# Patient Record
Sex: Male | Born: 1965 | Race: White | Hispanic: No | Marital: Married | Smoking: Never smoker
Health system: Southern US, Community
[De-identification: ages and names within clinical notes are randomized; demographics above are authoritative.]

## PROBLEM LIST (undated history)

## (undated) DIAGNOSIS — I10 Essential (primary) hypertension: Secondary | ICD-10-CM

## (undated) DIAGNOSIS — J40 Bronchitis, not specified as acute or chronic: Secondary | ICD-10-CM

## (undated) DIAGNOSIS — K760 Fatty (change of) liver, not elsewhere classified: Secondary | ICD-10-CM

## (undated) DIAGNOSIS — C8190 Hodgkin lymphoma, unspecified, unspecified site: Secondary | ICD-10-CM

## (undated) HISTORY — PX: HERNIA REPAIR: SHX51

## (undated) HISTORY — PX: SPLENECTOMY, TOTAL: SHX788

---
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
## 2015-07-08 ENCOUNTER — Encounter: Payer: Self-pay | Admitting: Emergency Medicine

## 2015-07-08 ENCOUNTER — Ambulatory Visit
Admission: EM | Admit: 2015-07-08 | Discharge: 2015-07-08 | Disposition: A | Discharge status: Home / Self Care | Payer: Federal, State, Local not specified - PPO | Attending: Family Medicine | Admitting: Family Medicine

## 2015-07-08 ENCOUNTER — Ambulatory Visit: Payer: Federal, State, Local not specified - PPO

## 2015-07-08 DIAGNOSIS — J40 Bronchitis, not specified as acute or chronic: Secondary | ICD-10-CM | POA: Diagnosis not present

## 2015-07-08 HISTORY — DX: Hodgkin lymphoma, unspecified, unspecified site: C81.90

## 2015-07-08 MED ORDER — PREDNISONE 20 MG PO TABS: ORAL_TABLET | ORAL | Status: DC

## 2015-07-08 NOTE — ED Provider Notes (Signed)
CSN: [id number]     Arrival date & time 07/08/15  1812 History   First MD Initiated Contact with Patient 07/08/15 1856     Chief Complaint  Patient presents with  . Cough   (Consider location/radiation/quality/duration/timing/severity/associated sxs/prior Treatment) HPI Comments: 49 yo non-smoker with a 2 months h/o cough, mostly dry. States was seen about 2 months ago at another facility for this and was treated with prednisone for one week with improvement of symptoms but not complete resolution. Denies any wheezing, fevers, chills, chest pains. States has felt slightly short of breath. Denies chest pains or swelling.   Patient is a 49 y.o. male presenting with cough. The history is provided by the patient.  Cough   Past Medical History  Diagnosis Date  . Hodgkin disease    Past Surgical History  Procedure Laterality Date  . Hernia repair    . Splenectomy, total     No family history on file. Social History  Substance Use Topics  . Smoking status: Never Smoker   . Smokeless tobacco: None  . Alcohol Use: No    Review of Systems  Respiratory: Positive for cough.     Allergies  Penicillins  Home Medications   Prior to Admission medications   Medication Sig Start Date End Date Taking? Authorizing Provider  predniSONE (DELTASONE) 20 MG tablet 3 tabs po qd for 2 days, then 2 tabs po qd for 4 days, then 1 tab po qd for 4 days, then half a tab po qd for 2 days 07/08/15   Norval Gable, MD   Meds Ordered and Administered this Visit  Medications - No data to display  BP 128/91 mmHg  Pulse 79  Temp(Src) 98 F (36.7 C) (Tympanic)  Resp 18  Ht 6\' 1"  (1.854 m)  Wt 210 lb (95.255 kg)  BMI 27.71 kg/m2  SpO2 96% No data found.   Physical Exam  Constitutional: He appears well-developed and well-nourished. No distress.  HENT:  Head: Normocephalic and atraumatic.  Right Ear: Tympanic membrane, external ear and ear canal normal.  Left Ear: Tympanic membrane, external ear  and ear canal normal.  Nose: Nose normal.  Mouth/Throat: Uvula is midline, oropharynx is clear and moist and mucous membranes are normal. No oropharyngeal exudate or tonsillar abscesses.  Eyes: Conjunctivae and EOM are normal. Pupils are equal, round, and reactive to light. Right eye exhibits no discharge. Left eye exhibits no discharge. No scleral icterus.  Neck: Normal range of motion. Neck supple. No tracheal deviation present. No thyromegaly present.  Cardiovascular: Normal rate, regular rhythm and normal heart sounds.   Pulmonary/Chest: Effort normal. No stridor. No respiratory distress. He has no wheezes. He has no rales. He exhibits no tenderness.  Few expiratory rhonchi bilaterally  Lymphadenopathy:    He has no cervical adenopathy.  Neurological: He is alert.  Skin: Skin is warm and dry. No rash noted. He is not diaphoretic.  Nursing note and vitals reviewed.   ED Course  Procedures (including critical care time)  Labs Review Labs Reviewed - No data to display  Imaging Review Dg Chest 2 View  07/08/2015   CLINICAL DATA:  Cough. Borderline hypertension. Bronchitis 2 months ago.  EXAM: CHEST  2 VIEW  COMPARISON:  None.  FINDINGS: Normal sized heart. Clear lungs. Minimal central peribronchial thickening. Upper abdominal surgical clips. Unremarkable bones.  IMPRESSION: Minimal bronchitic changes.   Electronically Signed   By: Claudie Revering M.D.   On: 07/08/2015 19:37     Visual  Acuity Review  Right Eye Distance:   Left Eye Distance:   Bilateral Distance:    Right Eye Near:   Left Eye Near:    Bilateral Near:         MDM   1. Bronchitis    New Prescriptions   PREDNISONE (DELTASONE) 20 MG TABLET    3 tabs po qd for 2 days, then 2 tabs po qd for 4 days, then 1 tab po qd for 4 days, then half a tab po qd for 2 days  Plan: 1. x-ray results and diagnosis reviewed with patient 2. rx as per orders; risks, benefits, potential side effects reviewed with patient 3.  Recommend supportive treatment with otc cough supresant prn 4. F/u prn if symptoms worsen or don't improve    Norval Gable, MD 07/08/15 [phone]

## 2015-07-08 NOTE — ED Notes (Signed)
Dry cough for 2 months has recently got worse.

## 2015-08-13 DIAGNOSIS — I10 Essential (primary) hypertension: Secondary | ICD-10-CM | POA: Insufficient documentation

## 2015-08-13 DIAGNOSIS — C8190 Hodgkin lymphoma, unspecified, unspecified site: Secondary | ICD-10-CM | POA: Insufficient documentation

## 2015-08-13 HISTORY — DX: Hodgkin lymphoma, unspecified, unspecified site: C81.90

## 2015-09-06 ENCOUNTER — Encounter: Payer: Self-pay | Admitting: Emergency Medicine

## 2015-09-06 ENCOUNTER — Emergency Department
Admission: EM | Admit: 2015-09-06 | Discharge: 2015-09-07 | Disposition: A | Discharge status: Home / Self Care | Payer: Federal, State, Local not specified - PPO | Attending: Emergency Medicine | Admitting: Emergency Medicine

## 2015-09-06 DIAGNOSIS — Z880 Allergy status to penicillin: Secondary | ICD-10-CM | POA: Diagnosis not present

## 2015-09-06 DIAGNOSIS — K8020 Calculus of gallbladder without cholecystitis without obstruction: Secondary | ICD-10-CM | POA: Diagnosis not present

## 2015-09-06 DIAGNOSIS — R1011 Right upper quadrant pain: Secondary | ICD-10-CM | POA: Diagnosis present

## 2015-09-06 LAB — COMPREHENSIVE METABOLIC PANEL
ALK PHOS: 56 U/L (ref 38–126)
ALT: 53 U/L (ref 17–63)
ANION GAP: 8 (ref 5–15)
AST: 40 U/L (ref 15–41)
Albumin: 4.4 g/dL (ref 3.5–5.0)
BILIRUBIN TOTAL: 0.3 mg/dL (ref 0.3–1.2)
BUN: 22 mg/dL — ABNORMAL HIGH (ref 6–20)
CALCIUM: 9.7 mg/dL (ref 8.9–10.3)
CO2: 31 mmol/L (ref 22–32)
Chloride: 102 mmol/L (ref 101–111)
Creatinine, Ser: 1.24 mg/dL (ref 0.61–1.24)
GLUCOSE: 145 mg/dL — AB (ref 65–99)
POTASSIUM: 4.4 mmol/L (ref 3.5–5.1)
Sodium: 141 mmol/L (ref 135–145)
TOTAL PROTEIN: 7.9 g/dL (ref 6.5–8.1)

## 2015-09-06 LAB — URINALYSIS COMPLETE WITH MICROSCOPIC (ARMC ONLY)
BACTERIA UA: NONE SEEN
Bilirubin Urine: NEGATIVE
GLUCOSE, UA: NEGATIVE mg/dL
HGB URINE DIPSTICK: NEGATIVE
KETONES UR: NEGATIVE mg/dL
LEUKOCYTES UA: NEGATIVE
NITRITE: NEGATIVE
PROTEIN: NEGATIVE mg/dL
RBC / HPF: NONE SEEN RBC/hpf (ref 0–5)
SPECIFIC GRAVITY, URINE: 1.018 (ref 1.005–1.030)
Squamous Epithelial / LPF: NONE SEEN
pH: 5 (ref 5.0–8.0)

## 2015-09-06 LAB — CBC
HEMATOCRIT: 45.7 % (ref 40.0–52.0)
HEMOGLOBIN: 15 g/dL (ref 13.0–18.0)
MCH: 29.2 pg (ref 26.0–34.0)
MCHC: 32.8 g/dL (ref 32.0–36.0)
MCV: 89 fL (ref 80.0–100.0)
Platelets: 279 10*3/uL (ref 150–440)
RBC: 5.14 MIL/uL (ref 4.40–5.90)
RDW: 13.5 % (ref 11.5–14.5)
WBC: 12.5 10*3/uL — AB (ref 3.8–10.6)

## 2015-09-06 LAB — TROPONIN I: Troponin I: <0.03 ng/mL (ref <0.031)

## 2015-09-06 LAB — LIPASE, BLOOD: Lipase: 33 U/L (ref 11–51)

## 2015-09-06 MED ORDER — MORPHINE SULFATE (PF) 2 MG/ML IV SOLN
2.0000 mg | Freq: Once | INTRAVENOUS | Status: AC
  Administered 2015-09-06: 2 mg via INTRAVENOUS

## 2015-09-06 MED ORDER — ONDANSETRON HCL 4 MG/2ML IJ SOLN
4.0000 mg | Freq: Once | INTRAMUSCULAR | Status: AC
  Administered 2015-09-06: 4 mg via INTRAVENOUS

## 2015-09-06 MED ORDER — MORPHINE SULFATE (PF) 2 MG/ML IV SOLN
2.0000 mg | Freq: Once | INTRAVENOUS | Status: AC
  Filled 2015-09-06: qty 1

## 2015-09-06 MED ORDER — ONDANSETRON HCL 4 MG/2ML IJ SOLN
4.0000 mg | Freq: Once | INTRAMUSCULAR | Status: AC
  Filled 2015-09-06: qty 2

## 2015-09-06 NOTE — ED Provider Notes (Signed)
[facility] Emergency Department Provider Note  ____________________________________________  Time seen: 10:45 PM  I have reviewed the triage vital signs and the nursing notes.   HISTORY  Chief Complaint Abdominal Pain     HPI Albert Meyer is a 49 y.o. male presents with acute onset of right upper quadrant/epigastric abdominal pain at 6:30 PM this evening. Patient admits to previous episode of the same following eating. Patient states pain occurred today before having dinner. Patient admits to nausea no fever or diarrhea     Past Medical History  Diagnosis Date  . Hodgkin disease ([place])     There are no active problems to display for this patient.   Past Surgical History  Procedure Laterality Date  . Hernia repair    . Splenectomy, total      Current Outpatient Rx  Name  Route  Sig  Dispense  Refill  . predniSONE (DELTASONE) 20 MG tablet      3 tabs po qd for 2 days, then 2 tabs po qd for 4 days, then 1 tab po qd for 4 days, then half a tab po qd for 2 days   20 tablet   0     Allergies Penicillins  No family history on file.  Social History Social History  Substance Use Topics  . Smoking status: Never Smoker   . Smokeless tobacco: None  . Alcohol Use: No    Review of Systems  Constitutional: Negative for fever. Eyes: Negative for visual changes. ENT: Negative for sore throat. Cardiovascular: Negative for chest pain. Respiratory: Negative for shortness of breath. Gastrointestinal: Positive right upper quadrant abdominal pain, negative vomiting and diarrhea. Genitourinary: Negative for dysuria. Musculoskeletal: Negative for back pain. Skin: Negative for rash. Neurological: Negative for headaches, focal weakness or numbness.   10-point ROS otherwise negative.  ____________________________________________   PHYSICAL EXAM:  VITAL SIGNS: ED Triage Vitals  Enc Vitals Group     BP 09/06/15 2132 170/78 mmHg     Pulse  Rate 09/06/15 2132 77     Resp 09/06/15 2132 20     Temp 09/06/15 2132 97.8 F (36.6 C)     Temp Source 09/06/15 2132 Oral     SpO2 09/06/15 2132 97 %     Weight 09/06/15 2132 200 lb (90.719 kg)     Height 09/06/15 2132 6\' 1"  (1.854 m)     Head Cir --      Peak Flow --      Pain Score 09/06/15 2134 8     Pain Loc --      Pain Edu? --      Excl. in [place]? --      Constitutional: Alert and oriented. Well appearing and in no distress. Eyes: Conjunctivae are normal. PERRL. Normal extraocular movements. ENT   Head: Normocephalic and atraumatic.   Nose: No congestion/rhinnorhea.   Mouth/Throat: Mucous membranes are moist.   Neck: No stridor. Hematological/Lymphatic/Immunilogical: No cervical lymphadenopathy. Cardiovascular: Normal rate, regular rhythm. Normal and symmetric distal pulses are present in all extremities. No murmurs, rubs, or gallops. Respiratory: Normal respiratory effort without tachypnea nor retractions. Breath sounds are clear and equal bilaterally. No wheezes/rales/rhonchi. Gastrointestinal: Right upper quadrant tenderness to palpation. No distention. There is no CVA tenderness. Genitourinary: deferred Musculoskeletal: Nontender with normal range of motion in all extremities. No joint effusions.  No lower extremity tenderness nor edema. Neurologic:  Normal speech and language. No gross focal neurologic deficits are appreciated. Speech is normal.  Skin:  Skin  is warm, dry and intact. No rash noted. Psychiatric: Mood and affect are normal. Speech and behavior are normal. Patient exhibits appropriate insight and judgment.  ____________________________________________    LABS (pertinent positives/negatives)  Labs Reviewed  COMPREHENSIVE METABOLIC PANEL - Abnormal; Notable for the following:    Glucose, Bld 145 (*)    BUN 22 (*)    All other components within normal limits  CBC - Abnormal; Notable for the following:    WBC 12.5 (*)    All other components  within normal limits  URINALYSIS COMPLETEWITH MICROSCOPIC ([facility] ONLY) - Abnormal; Notable for the following:    Color, Urine YELLOW (*)    APPearance CLEAR (*)    All other components within normal limits  LIPASE, BLOOD  TROPONIN I     ____________________________________________   EKG  ED ECG REPORT I, BROWN, Hobbs N, the attending physician, personally viewed and interpreted this ECG.   Date: 09/07/2015  EKG Time: 9:42 PM  Rate: 69  Rhythm: Normal sinus rhythm  Axis: None  Intervals: Normal  ST&T Change: None   ____________________________________________    RADIOLOGY  US Abdomen Limited RUQ (Final result) Result time: 09/07/15 01:14:55   Procedure changed from US Abdomen Limited      Final result by Rad Results In Interface (09/07/15 01:14:55)   Narrative:   CLINICAL DATA: Acute onset of right upper quadrant abdominal pain. Initial encounter.  EXAM: US ABDOMEN LIMITED - RIGHT UPPER QUADRANT  COMPARISON: None.  FINDINGS: Gallbladder:  Multiple large nonmobile stones are seen, with a 1.8 cm stone seen lodged at the neck of the gallbladder. No gallbladder wall thickening or pericholecystic fluid is seen, but a positive ultrasonographic Murphy's sign is elicited.  Common bile duct:  Diameter: 0.4 cm, within normal limits in caliber.  Liver:  No focal lesion identified. Within normal limits in parenchymal echogenicity.  IMPRESSION: 1.8 cm stone lodged at the neck of the gallbladder, with additional large stones seen. Positive ultrasonographic Murphy's sign elicited. This raises concern for obstruction due to the stone. No evidence for cholecystitis.   Electronically Signed By: Garald Balding M.D. On: 09/07/2015 01:14         INITIAL IMPRESSION / ASSESSMENT AND PLAN / ED COURSE  Pertinent labs & imaging results that were available during my care of the patient were reviewed by me and considered in my medical decision making  (see chart for details). Patient received IV morphine and Zofran for analgesia and antiemetic. History of physical exam consistent with cholelithiasis as such ultrasound was performed which confirmed the presence of cholelithiasis with a large stone lodged in the neck of the gallbladder. Given this finding Dr. Rexene Edison general surgeon on call was consult with ____________________________________________   FINAL CLINICAL IMPRESSION(S) / ED DIAGNOSES  Final diagnoses:  Calculus of gallbladder without cholecystitis without obstruction      Gregor Hams, MD 09/07/15 [phone]

## 2015-09-06 NOTE — ED Notes (Signed)
Dr. Owens Shark gave verbal orders for 4mg  Zofran IV, 2mg  Morphine IV.  Orders repeated and entered.

## 2015-09-06 NOTE — ED Notes (Signed)
MD at bedside. 

## 2015-09-06 NOTE — ED Notes (Addendum)
Patient ambulatory to triage with steady gait, without difficulty or distress noted; pt reports right upper abd pain radiating into right lower back since 7pm accomp by urinary frequency and SHOB and nausea

## 2015-09-07 ENCOUNTER — Emergency Department (HOSPITAL_COMMUNITY): Payer: Federal, State, Local not specified - PPO

## 2015-09-07 ENCOUNTER — Emergency Department: Payer: Federal, State, Local not specified - PPO

## 2015-09-07 DIAGNOSIS — K8020 Calculus of gallbladder without cholecystitis without obstruction: Secondary | ICD-10-CM

## 2015-09-07 DIAGNOSIS — R1011 Right upper quadrant pain: Secondary | ICD-10-CM | POA: Insufficient documentation

## 2015-09-07 MED ORDER — ONDANSETRON 4 MG PO TBDP: 4.0000 mg | ORAL_TABLET | Freq: Three times a day (TID) | ORAL | Status: DC | PRN

## 2015-09-07 MED ORDER — OXYCODONE-ACETAMINOPHEN 5-325 MG PO TABS: 1.0000 | ORAL_TABLET | ORAL | Status: DC | PRN

## 2015-09-07 NOTE — ED Notes (Signed)
Provided patient education on untreated hypertension in relation to increased stroke risk and kidney damage.

## 2015-09-07 NOTE — Discharge Instructions (Signed)

## 2015-09-07 NOTE — Consult Note (Signed)
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  Began suddenly after dinner.  Had chicken wings and french fries for dinner. Similar episode 1 week ago which resolved spontaneously.  Currently without any pain whatsoever.  No sick contacts, no others who enjoyed same meal with similar pains.  No fevers/chills, night sweats, shortness of breath, cough, chest pain, nausea/vomiting, diarrhea/constipation, dysuria/hematuria.    Active Ambulatory Problems    Diagnosis Date Noted  . Calculus of gallbladder without cholecystitis without obstruction   . RUQ pain    Resolved Ambulatory Problems    Diagnosis Date Noted  . No Resolved Ambulatory Problems   Past Medical History  Diagnosis Date  . Hodgkin disease [facility])    Past Surgical History  Procedure Laterality Date  . Hernia repair    . Splenectomy, total       Medication List    TAKE these medications        ondansetron 4 MG disintegrating tablet  Commonly known as:  ZOFRAN ODT  Take 1 tablet (4 mg total) by mouth every 8 (eight) hours as needed for nausea or vomiting.     oxyCODONE-acetaminophen 5-325 MG tablet  Commonly known as:  ROXICET  Take 1 tablet by mouth every 4 (four) hours as needed for severe pain.      ASK your doctor about these medications        predniSONE 20 MG tablet  Commonly known as:  DELTASONE  3 tabs po qd for 2 days, then 2 tabs po qd for 4 days, then 1 tab po qd for 4 days, then half a tab po qd for 2 days       Allergies  Allergen Reactions  . Penicillins    No family history on file.   Social History   Social History  . Marital Status: Married    Spouse Name: N/A  . Number of Children: N/A  . Years of Education: N/A   Occupational History  . Not on file.   Social History Main Topics  . Smoking status: Never Smoker   . Smokeless tobacco: Not on file  . Alcohol Use: No  .  Drug Use: No  . Sexual Activity: Not on file   Other Topics Concern  . Not on file   Social History Narrative   ROS: Full ROS obtained, pertinent positives and negatives as above  Blood pressure 143/97, pulse 73, temperature 97.8 F (36.6 C), temperature source Oral, resp. rate 14, height 6\' 1"  (1.854 m), weight 200 lb (90.719 kg), SpO2 97 %. GEN: NAD/A&Ox3 FACE: no obvious facial trauma, normal external nose, normal external ears EYES: no scleral icterus, no conjunctivitis HEAD: normocephalic atraumatic CV: RRR, no MRG RESP: moving air well, lungs clear ABD: soft, nontender, nondistended, large transverse epigastric incision EXT: moving all ext well, strength 5/5 NEURO: cnII-XII grossly intact, sensation intact all 4 ext  Labs: Personally reviewed, significant for  WBC 12.5 Bili 0.3 AST/ALT 40/53 Lipase 33  Ultrasound: Personally reviewed, significant for  Multiple large nonmobile stones with lodged stone in neck of GB  A/P 49 yo M with gallstones, recurrent RUQ pain, now resolved.  I have discussed laparoscopic cholecystectomy with him and that he would likely have another episode in the future, but in the absence of pain and with normal labs and VS that it is not emergent.  He  would like to schedule a surgery in the future as an outpatient.  I have told him the risks and benefits of the procedure and what signs and symptoms he should return to the ER or to call our office with.  He and his wife expressed understanding.

## 2015-09-08 ENCOUNTER — Telehealth: Payer: Self-pay | Admitting: General Surgery

## 2015-09-08 NOTE — Telephone Encounter (Signed)
yes

## 2015-09-08 NOTE — Telephone Encounter (Signed)
L/M ON PTS C# ASKING HIM TO CALL Monday 09-13-15 TO [facility] AN APPT .OKED PER JWB  (SEEN IN ER &HAD U/'S @ [facility] 09-06-15,EVAL GB) SEE TELEPHONE ENCOUNTER I SENT TO DR BYRNETT & HIS RESPONSE/MTH

## 2015-09-08 NOTE — Telephone Encounter (Signed)
PT CALLED & WAS SEEN IN THE ER ON 09-07-15 FOR HIS GB(HAD ABD U/S)he HAS AN APPT WITH DR Delfino Lovett COOPER ON 10-01-15 & SX WOULD PROPERLY .THEY TOLD HIM TO CALL HERE & SEE IF WE COULD SEE HIM SOONER.PLEASE REVIEW HIS RECS & LET ME KNOW IF WE CAN SEE HIM & HOW SOON APPT IS NEEDED.TKS/MTH

## 2015-09-13 ENCOUNTER — Encounter: Payer: Self-pay | Admitting: General Surgery

## 2015-09-13 NOTE — Telephone Encounter (Signed)
ON 09-08-15 I RET'D CALL TO PT & L/M TO CALL BACK.WE WERE GOING TO OFFER AN APPT FOR 09-13-15. TODAY 09-13-15 I CALLED PT & HAVE SCH'D AN APPT WITH DR BYRNETT FOR 09-21-15./MTH

## 2015-09-21 ENCOUNTER — Ambulatory Visit (INDEPENDENT_AMBULATORY_CARE_PROVIDER_SITE_OTHER): Payer: Federal, State, Local not specified - PPO | Admitting: General Surgery

## 2015-09-21 ENCOUNTER — Encounter: Payer: Self-pay | Admitting: General Surgery

## 2015-09-21 VITALS — BP 136/78 | HR 76 | Resp 14 | Ht 74.0 in | Wt 201.0 lb

## 2015-09-21 DIAGNOSIS — K8020 Calculus of gallbladder without cholecystitis without obstruction: Secondary | ICD-10-CM | POA: Diagnosis not present

## 2015-09-21 NOTE — Progress Notes (Signed)
Patient ID: Albert Meyer, male   DOB: 1965/12/13, 49 y.o.   MRN: [id number]  Chief Complaint  Patient presents with  . Other    gallstones    HPI Albert Meyer is a 49 y.o. male here today for a evaluation of 3 episodes of abdominal pain His first episode was 09-02-15 that last 7-8 hours. Patient was seen in the ER on 09/06/15 and had a ultrasound done. He states the pain was upper right abdominal pain that lasted 7-8 hours. He had eaten something fatty and fried prior to the episode. He was nauseated but no vomiting.   His most recent episode was 09-08-15, which only lasted 25 minutes. This episode was associated with chills. No change in the color of his urine during these episodes. He has since then stopped drinking energy drinks (caffeine) and is making better choices with his foods and no further episodes. He works in [employer], Dance movement psychotherapist. I personally reviewed the patient's medical history. HPI  Past Medical History  Diagnosis Date  . Hodgkin disease ([place]) 1978    Past Surgical History  Procedure Laterality Date  . Hernia repair    . Splenectomy, total      Family History  Problem Relation Age of Onset  . Diabetes Mother     Social History Social History  Substance Use Topics  . Smoking status: Never Smoker   . Smokeless tobacco: Never Used  . Alcohol Use: No    Allergies  Allergen Reactions  . Penicillins     No current outpatient prescriptions on file.   No current facility-administered medications for this visit.    Review of Systems Review of Systems  Constitutional: Negative.   Respiratory: Negative.   Cardiovascular: Negative.   Gastrointestinal: Positive for nausea and abdominal pain. Negative for vomiting and diarrhea.    Blood pressure 136/78, pulse 76, resp. rate 14, height 6\' 2"  (1.88 m), weight 201 lb (91.173 kg).  Physical Exam Physical Exam  Constitutional: He is oriented to person, place, and time. He appears well-developed and  well-nourished.  HENT:  Mouth/Throat: Oropharynx is clear and moist.  Eyes: Conjunctivae are normal. No scleral icterus.  Neck: Neck supple.  Cardiovascular: Normal rate, regular rhythm and normal heart sounds.   Pulmonary/Chest: Effort normal and breath sounds normal.  Abdominal: Soft. Normal appearance and bowel sounds are normal. There is no tenderness.    Lymphadenopathy:    He has no cervical adenopathy.  Neurological: He is alert and oriented to person, place, and time.  Skin: Skin is warm and dry.  Psychiatric: His behavior is normal.    Data Reviewed CLINICAL DATA: Acute onset of right upper quadrant abdominal pain. Initial encounter.  EXAM: US ABDOMEN LIMITED - RIGHT UPPER QUADRANT  COMPARISON: None.  FINDINGS: Gallbladder:  Multiple large nonmobile stones are seen, with a 1.8 cm stone seen lodged at the neck of the gallbladder. No gallbladder wall thickening or pericholecystic fluid is seen, but a positive ultrasonographic Murphy's sign is elicited.  Common bile duct:  Diameter: 0.4 cm, within normal limits in caliber.  Liver:  No focal lesion identified. Within normal limits in parenchymal echogenicity.  IMPRESSION: 1.8 cm stone lodged at the neck of the gallbladder, with additional large stones seen. Positive ultrasonographic Murphy's sign elicited. This raises concern for obstruction due to the stone. No evidence for cholecystitis.  Laboratory studies of 09/06/2015 showed a white blood cell count 12,500. Elevated blood sugar 145, creatinine 1.2. Normal liver function studies.  Assessment  Biliary colic.    Plan    Indications for surgical intervention were reviewed. The possibility of scarring from his previous laparotomy were discussed. Laparoscopy would still be the first choice for evaluation. He may be@at  higher risk for an open procedure.    Laparoscopic Cholecystectomy with Intraoperative Cholangiogram. The procedure,  including it's potential risks and complications (including but not limited to infection, bleeding, injury to intra-abdominal organs or bile ducts, bile leak, poor cosmetic result, sepsis and death) were discussed with the patient in detail. Non-operative options, including their inherent risks (acute calculous cholecystitis with possible choledocholithiasis or gallstone pancreatitis, with the risk of ascending cholangitis, sepsis, and death) were discussed as well. The patient expressed and understanding of what we discussed and wishes to proceed with laparoscopic cholecystectomy. The patient further understands that if it is technically not possible, or it is unsafe to proceed laparoscopically, that I will convert to an open cholecystectomy.   PCP: Dr Newell Coral at [place] 09/22/2015, 4:02 PM

## 2015-09-21 NOTE — Patient Instructions (Signed)
Laparoscopic Cholecystectomy Laparoscopic cholecystectomy is surgery to remove the gallbladder. The gallbladder is located in the upper right part of the abdomen, behind the liver. It is a storage sac for bile, which is produced in the liver. Bile aids in the digestion and absorption of fats. Cholecystectomy is often done for inflammation of the gallbladder (cholecystitis). This condition is usually caused by a buildup of gallstones (cholelithiasis) in the gallbladder. Gallstones can block the flow of bile, and that can result in inflammation and pain. In severe cases, emergency surgery may be required. If emergency surgery is not required, you will have time to prepare for the procedure. Laparoscopic surgery is an alternative to open surgery. Laparoscopic surgery has a shorter recovery time. Your common bile duct may also need to be examined during the procedure. If stones are found in the common bile duct, they may be removed. LET YOUR HEALTH CARE PROVIDER KNOW ABOUT:  Any allergies you have.  All medicines you are taking, including vitamins, herbs, eye drops, creams, and over-the-counter medicines.  Previous problems you or members of your family have had with the use of anesthetics.  Any blood disorders you have.  Previous surgeries you have had.  Any medical conditions you have. RISKS AND COMPLICATIONS Generally, this is a safe procedure. However, problems may occur, including:  Infection.  Bleeding.  Allergic reactions to medicines.  Damage to other structures or organs.  A stone remaining in the common bile duct.  A bile leak from the cyst duct that is clipped when your gallbladder is removed.  The need to convert to open surgery, which requires a larger incision in the abdomen. This may be necessary if your surgeon thinks that it is not safe to continue with a laparoscopic procedure. BEFORE THE PROCEDURE  Ask your health care provider about:  Changing or stopping your  regular medicines. This is especially important if you are taking diabetes medicines or blood thinners.  Taking medicines such as aspirin and ibuprofen. These medicines can thin your blood. Do not take these medicines before your procedure if your health care provider instructs you not to.  Follow instructions from your health care provider about eating or drinking restrictions.  Let your health care provider know if you develop a cold or an infection before surgery.  Plan to have someone take you home after the procedure.  Ask your health care provider how your surgical site will be marked or identified.  You may be given antibiotic medicine to help prevent infection. PROCEDURE  To reduce your risk of infection:  Your health care team will wash or sanitize their hands.  Your skin will be washed with soap.  An IV tube may be inserted into one of your veins.  You will be given a medicine to make you fall asleep (general anesthetic).  A breathing tube will be placed in your mouth.  The surgeon will make several small cuts (incisions) in your abdomen.  A thin, lighted tube (laparoscope) that has a tiny camera on the end will be inserted through one of the small incisions. The camera on the laparoscope will send a picture to a TV screen (monitor) in the operating room. This will give the surgeon a good view inside your abdomen.  A gas will be pumped into your abdomen. This will expand your abdomen to give the surgeon more room to perform the surgery.  Other tools that are needed for the procedure will be inserted through the other incisions. The gallbladder will   be removed through one of the incisions.  After your gallbladder has been removed, the incisions will be closed with stitches (sutures), staples, or skin glue.  Your incisions may be covered with a bandage (dressing). The procedure may vary among health care providers and hospitals. AFTER THE PROCEDURE  Your blood  pressure, heart rate, breathing rate, and blood oxygen level will be monitored often until the medicines you were given have worn off.  You will be given medicines as needed to control your pain.   This information is not intended to replace advice given to you by your health care provider. Make sure you discuss any questions you have with your health care provider.   Document Released: 10/02/2005 Document Revised: 06/23/2015 Document Reviewed: 05/14/2013 Elsevier Interactive Patient Education 2016 Elsevier Inc.  

## 2015-09-22 ENCOUNTER — Telehealth: Payer: Self-pay | Admitting: *Deleted

## 2015-09-22 DIAGNOSIS — K8020 Calculus of gallbladder without cholecystitis without obstruction: Secondary | ICD-10-CM | POA: Insufficient documentation

## 2015-09-22 NOTE — H&P (Signed)
HPI  Albert Meyer is a 49 y.o. male here today for a evaluation of 3 episodes of abdominal pain His first episode was 09-02-15 that last 7-8 hours. Patient was seen in the ER on 09/06/15 and had a ultrasound done. He states the pain was upper right abdominal pain that lasted 7-8 hours. He had eaten something fatty and fried prior to the episode. He was nauseated but no vomiting. His most recent episode was 09-08-15, which only lasted 25 minutes. This episode was associated with chills. No change in the color of his urine during these episodes.  He has since then stopped drinking energy drinks (caffeine) and is making better choices with his foods and no further episodes.  He works in [employer], Dance movement psychotherapist.  I personally reviewed the patient's medical history.  HPI  Past Medical History   Diagnosis  Date   .  Hodgkin disease ([place])  1978    Past Surgical History   Procedure  Laterality  Date   .  Hernia repair     .  Splenectomy, total      Family History   Problem  Relation  Age of Onset   .  Diabetes  Mother     Social History  Social History   Substance Use Topics   .  Smoking status:  Never Smoker   .  Smokeless tobacco:  Never Used   .  Alcohol Use:  No    Allergies   Allergen  Reactions   .  Penicillins     No current outpatient prescriptions on file.    No current facility-administered medications for this visit.    Review of Systems  Review of Systems  Constitutional: Negative.  Respiratory: Negative.  Cardiovascular: Negative.  Gastrointestinal: Positive for nausea and abdominal pain. Negative for vomiting and diarrhea.   Blood pressure 136/78, pulse 76, resp. rate 14, height 6\' 2"  (1.88 m), weight 201 lb (91.173 kg).  Physical Exam  Physical Exam  Constitutional: He is oriented to person, place, and time. He appears well-developed and well-nourished.  HENT:  Mouth/Throat: Oropharynx is clear and moist.  Eyes: Conjunctivae are normal. No scleral icterus.    Neck: Neck supple.  Cardiovascular: Normal rate, regular rhythm and normal heart sounds.  Pulmonary/Chest: Effort normal and breath sounds normal.  Abdominal: Soft. Normal appearance and bowel sounds are normal. There is no tenderness.    Lymphadenopathy:  He has no cervical adenopathy.  Neurological: He is alert and oriented to person, place, and time.  Skin: Skin is warm and dry.  Psychiatric: His behavior is normal.   Data Reviewed  CLINICAL DATA: Acute onset of right upper quadrant abdominal pain.  Initial encounter.  EXAM:  US ABDOMEN LIMITED - RIGHT UPPER QUADRANT  COMPARISON: None.  FINDINGS:  Gallbladder:  Multiple large nonmobile stones are seen, with a 1.8 cm stone seen  lodged at the neck of the gallbladder. No gallbladder wall  thickening or pericholecystic fluid is seen, but a positive  ultrasonographic Murphy's sign is elicited.  Common bile duct:  Diameter: 0.4 cm, within normal limits in caliber.  Liver:  No focal lesion identified. Within normal limits in parenchymal  echogenicity.  IMPRESSION:  1.8 cm stone lodged at the neck of the gallbladder, with additional  large stones seen. Positive ultrasonographic Murphy's sign elicited.  This raises concern for obstruction due to the stone. No evidence  for cholecystitis.  Laboratory studies of 09/06/2015 showed a white blood cell count 12,500. Elevated blood sugar  145, creatinine 1.2. Normal liver function studies.  Assessment   Biliary colic.   Plan   Indications for surgical intervention were reviewed. The possibility of scarring from his previous laparotomy were discussed. Laparoscopy would still be the first choice for evaluation. He may be@at  higher risk for an open procedure.   Laparoscopic Cholecystectomy with Intraoperative Cholangiogram. The procedure, including it's potential risks and complications (including but not limited to infection, bleeding, injury to intra-abdominal organs or bile ducts,  bile leak, poor cosmetic result, sepsis and death) were discussed with the patient in detail. Non-operative options, including their inherent risks (acute calculous cholecystitis with possible choledocholithiasis or gallstone pancreatitis, with the risk of ascending cholangitis, sepsis, and death) were discussed as well. The patient expressed and understanding of what we discussed and wishes to proceed with laparoscopic cholecystectomy. The patient further understands that if it is technically not possible, or it is unsafe to proceed laparoscopically, that I will convert to an open cholecystectomy.  PCP: Dr Newell Coral at [place], [place]

## 2015-09-22 NOTE — Telephone Encounter (Signed)
Patient called back and wants to try and arrange surgery in December instead of January as previously stated.  This patient's surgery has been scheduled for 10-08-15 at [facility].

## 2015-09-22 NOTE — Telephone Encounter (Signed)
Message for patient to call the office.   We need to schedule patient's gallbladder surgery for January 2017.

## 2015-10-01 ENCOUNTER — Encounter: Payer: Self-pay | Admitting: *Deleted

## 2015-10-01 ENCOUNTER — Ambulatory Visit: Payer: Federal, State, Local not specified - PPO | Admitting: Surgery

## 2015-10-01 ENCOUNTER — Other Ambulatory Visit: Payer: Federal, State, Local not specified - PPO

## 2015-10-01 NOTE — Patient Instructions (Signed)
  Your procedure is scheduled on: 10-08-15 Report to [facility] To find out your arrival time please call [phone] between 1PM - 3PM on 10-07-15  Remember: Instructions that are not followed completely may result in serious medical risk, up to and including death, or upon the discretion of your surgeon and anesthesiologist your surgery may need to be rescheduled.    _X___ 1. Do not eat food or drink liquids after midnight. No gum chewing or hard candies.     _X___ 2. No Alcohol for 24 hours before or after surgery.   ____ 3. Bring all medications with you on the day of surgery if instructed.    ____ 4. Notify your doctor if there is any change in your medical condition     (cold, fever, infections).     Do not wear jewelry, make-up, hairpins, clips or nail polish.  Do not wear lotions, powders, or perfumes. You may wear deodorant.  Do not shave 48 hours prior to surgery. Men may shave face and neck.  Do not bring valuables to the hospital.    [facility] is not responsible for any belongings or valuables.               Contacts, dentures or bridgework may not be worn into surgery.  Leave your suitcase in the car. After surgery it may be brought to your room.  For patients admitted to the hospital, discharge time is determined by your treatment team.   Patients discharged the day of surgery will not be allowed to drive home.   Please read over the following fact sheets that you were given:    ____ Take these medicines the morning of surgery with A SIP OF WATER:    1. NONE  2.   3.   4.  5.  6.  ____ Fleet Enema (as directed)   ____ Use CHG Soap as directed  ____ Use inhalers on the day of surgery  ____ Stop metformin 2 days prior to surgery    ____ Take 1/2 of usual insulin dose the night before surgery and none on the morning of surgery.   ____ Stop Coumadin/Plavix/aspirin-N/A  ____ Stop Anti-inflammatories   ____ Stop  supplements until after surgery.    ____ Bring C-Pap to the hospital.

## 2015-10-08 ENCOUNTER — Ambulatory Visit: Payer: Federal, State, Local not specified - PPO

## 2015-10-08 ENCOUNTER — Ambulatory Visit: Payer: Federal, State, Local not specified - PPO | Admitting: Certified Registered Nurse Anesthetist

## 2015-10-08 ENCOUNTER — Ambulatory Visit
Admission: RE | Admit: 2015-10-08 | Discharge: 2015-10-08 | Disposition: A | Discharge status: Home / Self Care | Payer: Federal, State, Local not specified - PPO | Source: Ambulatory Visit | Attending: General Surgery | Admitting: General Surgery

## 2015-10-08 ENCOUNTER — Encounter
Admission: RE | Disposition: A | Discharge status: Home / Self Care | Payer: Self-pay | Source: Ambulatory Visit | Attending: General Surgery

## 2015-10-08 DIAGNOSIS — K660 Peritoneal adhesions (postprocedural) (postinfection): Secondary | ICD-10-CM | POA: Diagnosis not present

## 2015-10-08 DIAGNOSIS — Z833 Family history of diabetes mellitus: Secondary | ICD-10-CM | POA: Insufficient documentation

## 2015-10-08 DIAGNOSIS — Z9081 Acquired absence of spleen: Secondary | ICD-10-CM | POA: Diagnosis not present

## 2015-10-08 DIAGNOSIS — Z880 Allergy status to penicillin: Secondary | ICD-10-CM | POA: Diagnosis not present

## 2015-10-08 DIAGNOSIS — K8010 Calculus of gallbladder with chronic cholecystitis without obstruction: Secondary | ICD-10-CM | POA: Insufficient documentation

## 2015-10-08 DIAGNOSIS — K8044 Calculus of bile duct with chronic cholecystitis without obstruction: Secondary | ICD-10-CM | POA: Diagnosis not present

## 2015-10-08 DIAGNOSIS — Z8571 Personal history of Hodgkin lymphoma: Secondary | ICD-10-CM | POA: Insufficient documentation

## 2015-10-08 DIAGNOSIS — Z9889 Other specified postprocedural states: Secondary | ICD-10-CM | POA: Insufficient documentation

## 2015-10-08 DIAGNOSIS — Z419 Encounter for procedure for purposes other than remedying health state, unspecified: Secondary | ICD-10-CM

## 2015-10-08 HISTORY — PX: CHOLECYSTECTOMY: SHX55

## 2015-10-08 HISTORY — DX: Bronchitis, not specified as acute or chronic: J40

## 2015-10-08 SURGERY — LAPAROSCOPIC CHOLECYSTECTOMY
Anesthesia: General

## 2015-10-08 MED ORDER — DEXAMETHASONE SODIUM PHOSPHATE 10 MG/ML IJ SOLN
INTRAMUSCULAR | Status: DC | PRN
  Administered 2015-10-08: 5 mg via INTRAVENOUS

## 2015-10-08 MED ORDER — SUCCINYLCHOLINE CHLORIDE 20 MG/ML IJ SOLN
INTRAMUSCULAR | Status: DC | PRN
  Administered 2015-10-08: 160 mg via INTRAVENOUS

## 2015-10-08 MED ORDER — ACETAMINOPHEN 10 MG/ML IV SOLN
INTRAVENOUS | Status: AC
  Filled 2015-10-08: qty 100

## 2015-10-08 MED ORDER — MIDAZOLAM HCL 2 MG/2ML IJ SOLN
INTRAMUSCULAR | Status: DC | PRN
  Administered 2015-10-08: 2 mg via INTRAVENOUS

## 2015-10-08 MED ORDER — LIDOCAINE HCL (CARDIAC) 20 MG/ML IV SOLN
INTRAVENOUS | Status: DC | PRN
  Administered 2015-10-08: 100 mg via INTRAVENOUS

## 2015-10-08 MED ORDER — ONDANSETRON HCL 4 MG/2ML IJ SOLN: 4.0000 mg | Freq: Once | INTRAMUSCULAR | Status: DC | PRN

## 2015-10-08 MED ORDER — SODIUM CHLORIDE 0.9 % IV SOLN
INTRAVENOUS | Status: DC | PRN
  Administered 2015-10-08: 40 mL

## 2015-10-08 MED ORDER — GLYCOPYRROLATE 0.2 MG/ML IJ SOLN
INTRAMUSCULAR | Status: DC | PRN
  Administered 2015-10-08: 0.6 mg via INTRAVENOUS

## 2015-10-08 MED ORDER — NEOSTIGMINE METHYLSULFATE 10 MG/10ML IV SOLN
INTRAVENOUS | Status: DC | PRN
  Administered 2015-10-08: 4 mg via INTRAVENOUS

## 2015-10-08 MED ORDER — LACTATED RINGERS IR SOLN
Status: DC | PRN
  Administered 2015-10-08: 700 mL

## 2015-10-08 MED ORDER — PROPOFOL 10 MG/ML IV BOLUS
INTRAVENOUS | Status: DC | PRN
  Administered 2015-10-08: 200 mg via INTRAVENOUS

## 2015-10-08 MED ORDER — HYDROCODONE-ACETAMINOPHEN 5-325 MG PO TABS
ORAL_TABLET | ORAL | Status: DC
Start: 2015-10-08 — End: 2015-10-08
  Filled 2015-10-08: qty 1

## 2015-10-08 MED ORDER — FAMOTIDINE 20 MG PO TABS
ORAL_TABLET | ORAL | Status: AC
  Administered 2015-10-08: 20 mg via ORAL
  Filled 2015-10-08: qty 1

## 2015-10-08 MED ORDER — FENTANYL CITRATE (PF) 100 MCG/2ML IJ SOLN
INTRAMUSCULAR | Status: DC | PRN
  Administered 2015-10-08: 100 ug via INTRAVENOUS

## 2015-10-08 MED ORDER — ROCURONIUM BROMIDE 100 MG/10ML IV SOLN
INTRAVENOUS | Status: DC | PRN
  Administered 2015-10-08: 20 mg via INTRAVENOUS
  Administered 2015-10-08: 10 mg via INTRAVENOUS
  Administered 2015-10-08: 5 mg via INTRAVENOUS

## 2015-10-08 MED ORDER — HYDROCODONE-ACETAMINOPHEN 5-325 MG PO TABS: 1.0000 | ORAL_TABLET | ORAL | Status: DC | PRN

## 2015-10-08 MED ORDER — ONDANSETRON HCL 4 MG/2ML IJ SOLN
INTRAMUSCULAR | Status: DC | PRN
  Administered 2015-10-08: 4 mg via INTRAVENOUS

## 2015-10-08 MED ORDER — FAMOTIDINE 20 MG PO TABS
20.0000 mg | ORAL_TABLET | Freq: Once | ORAL | Status: AC
  Administered 2015-10-08: 20 mg via ORAL

## 2015-10-08 MED ORDER — LACTATED RINGERS IV SOLN
INTRAVENOUS | Status: DC
  Administered 2015-10-08: 07:00:00 via INTRAVENOUS

## 2015-10-08 MED ORDER — HYDROMORPHONE HCL 1 MG/ML IJ SOLN: 0.2500 mg | INTRAMUSCULAR | Status: DC | PRN

## 2015-10-08 MED ORDER — SODIUM CHLORIDE 0.9 % IJ SOLN
INTRAMUSCULAR | Status: AC
  Filled 2015-10-08: qty 50

## 2015-10-08 MED ORDER — HYDROCODONE-ACETAMINOPHEN 5-325 MG PO TABS
1.0000 | ORAL_TABLET | ORAL | Status: AC | PRN
  Administered 2015-10-08 (×2): 1 via ORAL

## 2015-10-08 MED ORDER — ACETAMINOPHEN 10 MG/ML IV SOLN
INTRAVENOUS | Status: DC | PRN
  Administered 2015-10-08: 1000 mg via INTRAVENOUS

## 2015-10-08 MED ORDER — KETOROLAC TROMETHAMINE 30 MG/ML IJ SOLN
INTRAMUSCULAR | Status: DC | PRN
  Administered 2015-10-08: 30 mg via INTRAVENOUS

## 2015-10-08 SURGICAL SUPPLY — 47 items
APPLIER CLIP ROT 10 11.4 M/L (STAPLE) ×3
BLADE SURG 11 STRL SS SAFETY (MISCELLANEOUS) ×3 IMPLANT
CANISTER SUCT 1200ML W/VALVE (MISCELLANEOUS) ×3 IMPLANT
CANNULA DILATOR  5MM W/SLV (CANNULA) ×2
CANNULA DILATOR 10 W/SLV (CANNULA) ×2 IMPLANT
CANNULA DILATOR 10MM W/SLV (CANNULA) ×1
CANNULA DILATOR 5 W/SLV (CANNULA) ×4 IMPLANT
CATH CHOLANG 76X19 KUMAR (CATHETERS) ×3 IMPLANT
CHLORAPREP W/TINT 26ML (MISCELLANEOUS) ×3 IMPLANT
CLIP APPLIE ROT 10 11.4 M/L (STAPLE) ×1 IMPLANT
CLOSURE WOUND 1/2 X4 (GAUZE/BANDAGES/DRESSINGS) ×1
CNTNR SPEC 2.5X3XGRAD LEK (MISCELLANEOUS) ×1
CONRAY 60ML FOR OR (MISCELLANEOUS) ×3 IMPLANT
CONT SPEC 4OZ STER OR WHT (MISCELLANEOUS) ×2
CONTAINER SPEC 2.5X3XGRAD LEK (MISCELLANEOUS) ×1 IMPLANT
DISSECTOR KITTNER STICK (MISCELLANEOUS) ×1 IMPLANT
DISSECTORS/KITTNER STICK (MISCELLANEOUS) ×3
DRAPE SHEET LG 3/4 BI-LAMINATE (DRAPES) ×3 IMPLANT
DRESSING TELFA 4X3 1S ST N-ADH (GAUZE/BANDAGES/DRESSINGS) ×3 IMPLANT
DRSG TEGADERM 2-3/8X2-3/4 SM (GAUZE/BANDAGES/DRESSINGS) ×12 IMPLANT
ENDOPOUCH RETRIEVER 10 (MISCELLANEOUS) ×3 IMPLANT
GLOVE BIO SURGEON STRL SZ7.5 (GLOVE) ×9 IMPLANT
GLOVE INDICATOR 7.5 STRL GRN (GLOVE) ×6 IMPLANT
GLOVE INDICATOR 8.0 STRL GRN (GLOVE) ×3 IMPLANT
GOWN STRL REUS W/ TWL LRG LVL3 (GOWN DISPOSABLE) ×3 IMPLANT
GOWN STRL REUS W/TWL LRG LVL3 (GOWN DISPOSABLE) ×6
IRRIGATION STRYKERFLOW (MISCELLANEOUS) ×1 IMPLANT
IRRIGATOR STRYKERFLOW (MISCELLANEOUS) ×3
IV LACTATED RINGERS 1000ML (IV SOLUTION) ×3 IMPLANT
KIT RM TURNOVER STRD PROC AR (KITS) ×3 IMPLANT
LABEL OR SOLS (LABEL) ×3 IMPLANT
NDL INSUFF ACCESS 14 VERSASTEP (NEEDLE) ×3 IMPLANT
NS IRRIG 500ML POUR BTL (IV SOLUTION) ×3 IMPLANT
PACK LAP CHOLECYSTECTOMY (MISCELLANEOUS) ×3 IMPLANT
PAD GROUND ADULT SPLIT (MISCELLANEOUS) ×3 IMPLANT
SCISSORS METZENBAUM CVD 33 (INSTRUMENTS) ×3 IMPLANT
SEAL FOR SCOPE WARMER C3101 (MISCELLANEOUS) ×3 IMPLANT
STRIP CLOSURE SKIN 1/2X4 (GAUZE/BANDAGES/DRESSINGS) ×2 IMPLANT
SUT PDS PLUS 0 (SUTURE) ×2
SUT PDS PLUS AB 0 CT-2 (SUTURE) ×1 IMPLANT
SUT VIC AB 0 CT2 27 (SUTURE) ×3 IMPLANT
SUT VIC AB 4-0 FS2 27 (SUTURE) ×3 IMPLANT
SUTURE ×3 IMPLANT
SWABSTK COMLB BENZOIN TINCTURE (MISCELLANEOUS) ×3 IMPLANT
TROCAR XCEL NON-BLD 11X100MML (ENDOMECHANICALS) ×3 IMPLANT
TUBING INSUFFLATOR HI FLOW (MISCELLANEOUS) ×3 IMPLANT
WATER STERILE IRR 1000ML POUR (IV SOLUTION) ×3 IMPLANT

## 2015-10-08 NOTE — Anesthesia Postprocedure Evaluation (Signed)
Anesthesia Post Note  Patient: Albert Meyer  Procedure(s) Performed: Procedure(s) (LRB): LAPAROSCOPIC CHOLECYSTECTOMY  With cholangiogram (N/A)  Patient location during evaluation: PACU Anesthesia Type: General Level of consciousness: awake Pain management: pain level controlled Vital Signs Assessment: post-procedure vital signs reviewed and stable Respiratory status: spontaneous breathing Cardiovascular status: blood pressure returned to baseline Postop Assessment: no headache Anesthetic complications: no    Last Vitals:  Filed Vitals:   10/08/15 0917 10/08/15 0918  BP:  112/74  Pulse:  79  Temp: 37.1 C 37.1 C  Resp:  20    Last Pain:  Filed Vitals:   10/08/15 0924  PainSc: Blase Mess, Kenden Brandt M

## 2015-10-08 NOTE — Anesthesia Procedure Notes (Signed)
Procedure Name: Intubation Date/Time: 10/08/2015 7:37 AM Performed by: Johnna Acosta Pre-anesthesia Checklist: Patient identified, Emergency Drugs available, Suction available and Patient being monitored Patient Re-evaluated:Patient Re-evaluated prior to inductionOxygen Delivery Method: Circle system utilized Preoxygenation: Pre-oxygenation with 100% oxygen Intubation Type: IV induction Ventilation: Mask ventilation without difficulty Laryngoscope Size: Miller and 2 Grade View: Grade I Tube type: Oral Tube size: 7.5 mm Number of attempts: 1 Placement Confirmation: ETT inserted through vocal cords under direct vision,  positive ETCO2 and breath sounds checked- equal and bilateral Secured at: 22 cm Tube secured with: Tape Dental Injury: Teeth and Oropharynx as per pre-operative assessment

## 2015-10-08 NOTE — Op Note (Signed)
Preoperative diagnosis: Chronic cholecystitis and cholelithiasis, previous laparotomy for Hodgkin's disease.  Postoperative diagnosis: Same.  Operative procedure: Laparoscopic cholecystectomy with intraoperative cholangiograms, lysis of adhesions.  Operating surgeon: Ollen Bowl, M.D.  Anesthesia: Gen. endotracheal.  Assessment blood loss: Less than 5 mL.  Clinical note: This 49 year old male developed symptomatic biliary disease. Ultrasound confirmed multiple large stones. He had undergone transverse laparotomy at age 49 for Hodgkin's staging. He had been advised that the increased risk for an open procedure based on adhesions.  Operative note: With the patient under adequate general endotracheal anesthesia the abdomen was prepped with ChloraPrep and draped. In Trendelenburg position a varies needle was placed through a trans-of vocal incision. After assuring intra-abdominal location with the hanging drop test the abdomen was insufflated with CO2 at 10 L Hg pressure. There was a band of the adhesions to the previous transverse laparotomies incision which was approximate 5 cm above the level of the umbilicus. A lateral port was placed under direct vision and with scissor cautery of this band of adhesion was taken down to expose the epigastrium. An 11 mm XL port was placed in the epigastrium after the patient placed in reverse Trendelenburg position and rolled to the left. A second 5 mm lateral step port was placed and inspection of the right upper quadrant showed adhesions of the omentum to the undersurface of the gallbladder. The gallbladder fundus was placed on cephalad traction. Adhesions between this liver capsule and anterior abdominal wall having previously been lysed with scissor dissection. The duodenum was found to be intimately associated with the neck of the gallbladder. This was gently dissected medially to allow full exposure of the cystic duct area. The cystic duct was isolated. A  Kumar clamp could not be placed and multiple large stones. This device was used to grasp the gallbladder and the needle advanced into the body of the gallbladder. This was then filled with one half straight Conray 60, a total volume of 40 mL utilized to fill the gallbladder with subsequent reflux and the cystic duct, common bile duct and partial visualization of the common hepatic duct. While there was no flow in the duodenum the distal common bile duct tapered normally and with the 2 cm stones it was felt unlikely that a retained fragment would have been missed.  The cystic duct was doubly clipped and divided the right hepatic artery was found run beneath the inferior edge of the gallbladder and this was carefully protected during dissection. Cystic artery was doubly clipped and divided. The gallbladder is removed from the liver bed making use of hook cautery dissection. Due to the anticipated difficulty getting these large stones out through the abdominal wall it was elected to place the gallbladder into an Endo Catch bag. This was then delivered to the umbilical port site. It was necessary to extend the incision at this area to a total length of approximately 2 cm. The multiple stones were extracted one provided to the patient's family. The pneumoperitoneum was reestablished and inspection from the epigastric site showed no evidence of injury from initial port placement. The adhesion sites were free of any bleeding. Irrigation of the right upper quadrant was completed with lactated Ringer's solution. Good hemostasis was noted. Clip lay still is appropriate. The abdomen was then desufflated and ports removed under direct vision. The fascia at the umbilicus was closed with interrupted 0 PDS figure-of-eight sutures. Skin incisions were closed with 4-0 Vicryl septic sutures. Benzoin, Steri-Strips, Telfa and Tegaderm dressings were applied.  The  patient tolerated the procedure well and was taken to recovery in  stable condition.

## 2015-10-08 NOTE — H&P (Signed)
No change in clinical symptoms or exam.  For cholecystectomy.

## 2015-10-08 NOTE — Transfer of Care (Signed)
Immediate Anesthesia Transfer of Care Note  Patient: Albert Meyer  Procedure(s) Performed: Procedure(s): LAPAROSCOPIC CHOLECYSTECTOMY  With cholangiogram (N/A)  Patient Location: PACU  Anesthesia Type:General  Level of Consciousness: sedated  Airway & Oxygen Therapy: Patient Spontanous Breathing and Patient connected to nasal cannula oxygen  Post-op Assessment: Report given to RN and Post -op Vital signs reviewed and stable  Post vital signs: Reviewed and stable  Last Vitals:  Filed Vitals:   10/08/15 0611  BP: 130/91  Pulse: 75  Temp: 35.9 C  Resp: 16    Complications: No apparent anesthesia complications

## 2015-10-08 NOTE — Discharge Instructions (Signed)
AMBULATORY SURGERY  °DISCHARGE INSTRUCTIONS ° ° °1) The drugs that you were given will stay in your system until tomorrow so for the next 24 hours you should not: ° °A) Drive an automobile °B) Make any legal decisions °C) Drink any alcoholic beverage ° ° °2) You may resume regular meals tomorrow.  Today it is better to start with liquids and gradually work up to solid foods. ° °You may eat anything you prefer, but it is better to start with liquids, then soup and crackers, and gradually work up to solid foods. ° ° °3) Please notify your doctor immediately if you have any unusual bleeding, trouble breathing, redness and pain at the surgery site, drainage, fever, or pain not relieved by medication. ° ° ° °4) Additional Instructions: ° ° ° ° ° ° ° °Please contact your physician with any problems or Same Day Surgery at [phone], Monday through Friday 6 am to 4 pm, or [place] at [facility] number at [phone].AMBULATORY SURGERY  °DISCHARGE INSTRUCTIONS ° ° °5) The drugs that you were given will stay in your system until tomorrow so for the next 24 hours you should not: ° °D) Drive an automobile °E) Make any legal decisions °F) Drink any alcoholic beverage ° ° °6) You may resume regular meals tomorrow.  Today it is better to start with liquids and gradually work up to solid foods. ° °You may eat anything you prefer, but it is better to start with liquids, then soup and crackers, and gradually work up to solid foods. ° ° °7) Please notify your doctor immediately if you have any unusual bleeding, trouble breathing, redness and pain at the surgery site, drainage, fever, or pain not relieved by medication. ° ° ° °8) Additional Instructions: ° ° ° ° ° ° ° °Please contact your physician with any problems or Same Day Surgery at [phone], Monday through Friday 6 am to 4 pm, or [place] at [place] [facility] number at [phone]. °

## 2015-10-08 NOTE — Anesthesia Preprocedure Evaluation (Signed)
Anesthesia Evaluation  Patient identified by MRN, date of birth, ID band Patient awake    Reviewed: Allergy & Precautions, NPO status , Patient's Chart, lab work & pertinent test results  Airway Mallampati: II  TM Distance: >3 FB Neck ROM: Full    Dental  (+) Teeth Intact   Pulmonary    Pulmonary exam normal        Cardiovascular Exercise Tolerance: Good negative cardio ROS Normal cardiovascular exam     Neuro/Psych    GI/Hepatic negative GI ROS, Neg liver ROS,   Endo/Other    Renal/GU      Musculoskeletal   Abdominal Normal abdominal exam  (+)   Peds  Hematology   Anesthesia Other Findings   Reproductive/Obstetrics                             Anesthesia Physical Anesthesia Plan  ASA: I  Anesthesia Plan: General   Post-op Pain Management:    Induction: Intravenous  Airway Management Planned: Oral ETT  Additional Equipment:   Intra-op Plan:   Post-operative Plan: Extubation in OR  Informed Consent: I have reviewed the patients History and Physical, chart, labs and discussed the procedure including the risks, benefits and alternatives for the proposed anesthesia with the patient or authorized representative who has indicated his/her understanding and acceptance.     Plan Discussed with: CRNA  Anesthesia Plan Comments:         Anesthesia Quick Evaluation

## 2015-10-12 LAB — SURGICAL PATHOLOGY

## 2015-10-19 ENCOUNTER — Ambulatory Visit (INDEPENDENT_AMBULATORY_CARE_PROVIDER_SITE_OTHER): Payer: Federal, State, Local not specified - PPO | Admitting: General Surgery

## 2015-10-19 VITALS — BP 128/78 | HR 76 | Resp 12 | Ht 72.0 in | Wt 190.0 lb

## 2015-10-19 DIAGNOSIS — K8020 Calculus of gallbladder without cholecystitis without obstruction: Secondary | ICD-10-CM

## 2015-10-19 NOTE — Progress Notes (Signed)
Patient ID: Albert Meyer, male   DOB: [birth year], 50 y.o.   MRN: [id number]  Chief Complaint  Patient presents with  . Routine Post Op    gallbladder    HPI Albert Meyer is a 50 y.o. male here today for his post op gallbladder surgery done on 10/08/15. Patient states he is doing well.   I personally reviewed the patient's history. HPI  Past Medical History  Diagnosis Date  . Hodgkin disease ([place]) 1978  . Bronchitis     OFF AND ON FROM SUMMER TO OCTOBER 2016-RESOLVED NOW    Past Surgical History  Procedure Laterality Date  . Hernia repair    . Splenectomy, total    . Cholecystectomy N/A 10/08/2015    Procedure: LAPAROSCOPIC CHOLECYSTECTOMY  With cholangiogram;  Surgeon: Robert Bellow, MD;  Location: [facility] ORS;  Service: General;  Laterality: N/A;    Family History  Problem Relation Age of Onset  . Diabetes Mother     Social History Social History  Substance Use Topics  . Smoking status: Never Smoker   . Smokeless tobacco: Never Used  . Alcohol Use: No    Allergies  Allergen Reactions  . Penicillins Swelling    Current Outpatient Prescriptions  Medication Sig Dispense Refill  . HYDROcodone-acetaminophen (NORCO) 5-325 MG tablet Take 1-2 tablets by mouth every 4 (four) hours as needed for moderate pain. 30 tablet 0   No current facility-administered medications for this visit.    Review of Systems Review of Systems  Constitutional: Negative.   Respiratory: Negative.   Cardiovascular: Negative.     Blood pressure 128/78, pulse 76, resp. rate 12, height 6' (1.829 m), weight 190 lb (86.183 kg).  Physical Exam Physical Exam  Constitutional: He appears well-developed and well-nourished.  Cardiovascular: Normal rate, regular rhythm and normal heart sounds.   Pulmonary/Chest: Effort normal and breath sounds normal.  Abdominal: Soft. Normal appearance and bowel sounds are normal. There is no tenderness.    Port sites are clean and healing well.     Neurological: He is alert.  Skin: Skin is warm and dry.    Data Reviewed Pathology showed chronic cholecystitis and cholelithiasis.  Assessment    Pain-free status post cholecystectomy.    Plan    Increase in activity reviewed. Proper lifting technique discussed.   Patient to return as needed.  PCP:  Carron [name] This information has been scribed by Gaspar Cola CMA.    Robert Bellow 10/19/2015, 10:12 PM

## 2015-10-19 NOTE — Patient Instructions (Signed)
Patient to return as needed. 

## 2017-10-29 ENCOUNTER — Encounter: Payer: Self-pay | Admitting: Emergency Medicine

## 2017-10-29 ENCOUNTER — Other Ambulatory Visit: Payer: Self-pay

## 2017-10-29 ENCOUNTER — Ambulatory Visit
Admission: EM | Admit: 2017-10-29 | Discharge: 2017-10-29 | Disposition: A | Discharge status: Home / Self Care | Payer: Federal, State, Local not specified - PPO | Attending: Family Medicine | Admitting: Family Medicine

## 2017-10-29 DIAGNOSIS — H9313 Tinnitus, bilateral: Secondary | ICD-10-CM

## 2017-10-29 DIAGNOSIS — I10 Essential (primary) hypertension: Secondary | ICD-10-CM

## 2017-10-29 DIAGNOSIS — R51 Headache: Secondary | ICD-10-CM | POA: Diagnosis not present

## 2017-10-29 NOTE — ED Provider Notes (Signed)
[facility]    CSN: [id number] Arrival date & time: 10/29/17  1145     History   Chief Complaint Chief Complaint  Patient presents with  . Headache  . Tinnitus    HPI Albert Meyer is a 52 y.o. male.   52 yo male with a c/o headaches on and off for 3 weeks and ringing in the ears for one week. States he had been taking [employer] drinks for months and started checking his blood pressures last week and they were high with systolic in the 706-237. He stopped the energy drinks 3 days ago. Denies any ear pain, injuries, trauma, drainage, vision problems, numbness/tingling, weakness, chest pains, shortness of breath. 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    The history is provided by the patient.  Headache    Past Medical History:  Diagnosis Date  . Bronchitis    OFF AND ON FROM SUMMER TO OCTOBER 2016-RESOLVED NOW  . Hodgkin disease [facility]) 1978    Patient Active Problem List   Diagnosis Date Noted  . Gallstone 09/22/2015  . Calculus of gallbladder without cholecystitis without obstruction   . RUQ pain     Past Surgical History:  Procedure Laterality Date  . CHOLECYSTECTOMY N/A 10/08/2015   Procedure: LAPAROSCOPIC CHOLECYSTECTOMY  With cholangiogram;  Surgeon: Robert Bellow, MD;  Location: [facility] ORS;  Service: General;  Laterality: N/A;  . HERNIA REPAIR    . SPLENECTOMY, TOTAL         Home Medications    Prior to Admission medications   Medication Sig Start Date End Date Taking? Authorizing Provider  HYDROcodone-acetaminophen (NORCO) 5-325 MG tablet Take 1-2 tablets by mouth every 4 (four) hours as needed for moderate pain. 10/08/15   Robert Bellow, MD    Family History Family History  Problem Relation Age of Onset  . Lung disease Father   . Diabetes Mother     Social History Social History   Tobacco Use  . Smoking status: Never Smoker  . Smokeless tobacco: Never Used    Substance Use Topics  . Alcohol use: No    Alcohol/week: 0.0 oz  . Drug use: No     Allergies   Penicillins   Review of Systems Review of Systems  Neurological: Positive for headaches.     Physical Exam Triage Vital Signs ED Triage Vitals  Enc Vitals Group     BP 10/29/17 1207 (!) 142/98     Pulse Rate 10/29/17 1207 84     Resp 10/29/17 1207 16     Temp 10/29/17 1207 98.4 F (36.9 C)     Temp Source 10/29/17 1207 Oral     SpO2 10/29/17 1207 98 %     Weight 10/29/17 1207 205 lb (93 kg)     Height 10/29/17 1207 6\' 2"  (1.88 m)     Head Circumference --      Peak Flow --      Pain Score 10/29/17 1208 0     Pain Loc --      Pain Edu? --      Excl. in [place]? --    No data found.  Updated Vital Signs BP (!) 142/98 (BP Location: Left Arm)   Pulse 84   Temp 98.4 F (36.9 C) (Oral)   Resp 16   Ht 6\' 2"  (1.88 m)   Wt 205 lb (93 kg)   SpO2 98%   BMI  26.32 kg/m   Visual Acuity Right Eye Distance:   Left Eye Distance:   Bilateral Distance:    Right Eye Near:   Left Eye Near:    Bilateral Near:     Physical Exam  Constitutional: He is oriented to person, place, and time. He appears well-developed and well-nourished. No distress.  HENT:  Head: Normocephalic and atraumatic.  Right Ear: Tympanic membrane, external ear and ear canal normal.  Left Ear: Tympanic membrane, external ear and ear canal normal.  Nose: Nose normal.  Mouth/Throat: Uvula is midline, oropharynx is clear and moist and mucous membranes are normal. No oropharyngeal exudate or tonsillar abscesses.  Eyes: Conjunctivae and EOM are normal. Pupils are equal, round, and reactive to light. Right eye exhibits no discharge. Left eye exhibits no discharge. No scleral icterus.  Neck: Normal range of motion. Neck supple. No tracheal deviation present. No thyromegaly present.  Cardiovascular: Normal rate, regular rhythm and normal heart sounds.  Pulmonary/Chest: Effort normal and breath sounds normal. No  stridor. No respiratory distress. He has no wheezes. He has no rales. He exhibits no tenderness.  Lymphadenopathy:    He has no cervical adenopathy.  Neurological: He is alert and oriented to person, place, and time. He displays normal reflexes. No cranial nerve deficit or sensory deficit. He exhibits normal muscle tone. Coordination normal.  Skin: Skin is warm and dry. No rash noted. He is not diaphoretic.  Nursing note and vitals reviewed.    UC Treatments / Results  Labs (all labs ordered are listed, but only abnormal results are displayed) Labs Reviewed - No data to display  EKG  EKG Interpretation None       Radiology No results found.  Procedures Procedures (including critical care time)  Medications Ordered in UC Medications - No data to display   Initial Impression / Assessment and Plan / UC Course  I have reviewed the triage vital signs and the nursing notes.  Pertinent labs & imaging results that were available during my care of the patient were reviewed by me and considered in my medical decision making (see chart for details).       Final Clinical Impressions(s) / UC Diagnoses   Final diagnoses:  Tinnitus of both ears  Hypertension, unspecified type    ED Discharge Orders    None     1. diagnosis reviewed with patient 2. Recommend supportive treatment with dietary modifications, exercise, no energy drinks, monitor blood pressures  3. Establish care and follow up with PCP 4. Follow-up prn if symptoms worsen or don't improve Controlled Substance Prescriptions [place] Controlled Substance Registry consulted? Not Applicable   Norval Gable, MD 10/29/17 1750

## 2017-10-29 NOTE — ED Triage Notes (Signed)
Patient in today c/o headache off and on x 3 weeks and ringing in his ears x 8-9 days. Patient has been drinking energy drinks 2 per day for the last 3-4 months. Patient stopped drinking those on Saturday (10/27/17).

## 2017-11-01 ENCOUNTER — Telehealth: Payer: Self-pay | Admitting: Emergency Medicine

## 2017-11-01 NOTE — Telephone Encounter (Signed)
Called to follow up with patient after his recent visit. Left message to call for any questions or concerns.

## 2017-12-21 DIAGNOSIS — L309 Dermatitis, unspecified: Secondary | ICD-10-CM | POA: Insufficient documentation

## 2017-12-21 DIAGNOSIS — E669 Obesity, unspecified: Secondary | ICD-10-CM | POA: Insufficient documentation

## 2017-12-21 HISTORY — DX: Obesity, unspecified: E66.9

## 2017-12-24 DIAGNOSIS — D72829 Elevated white blood cell count, unspecified: Secondary | ICD-10-CM | POA: Insufficient documentation

## 2017-12-24 DIAGNOSIS — E559 Vitamin D deficiency, unspecified: Secondary | ICD-10-CM | POA: Insufficient documentation

## 2017-12-24 DIAGNOSIS — R972 Elevated prostate specific antigen [PSA]: Secondary | ICD-10-CM | POA: Insufficient documentation

## 2017-12-24 HISTORY — DX: Elevated prostate specific antigen (PSA): R97.20

## 2018-01-28 ENCOUNTER — Ambulatory Visit
Admission: EM | Admit: 2018-01-28 | Discharge: 2018-01-28 | Disposition: A | Discharge status: Home / Self Care | Payer: Federal, State, Local not specified - PPO | Attending: Family Medicine | Admitting: Family Medicine

## 2018-01-28 ENCOUNTER — Other Ambulatory Visit: Payer: Self-pay

## 2018-01-28 DIAGNOSIS — B9789 Other viral agents as the cause of diseases classified elsewhere: Secondary | ICD-10-CM | POA: Diagnosis not present

## 2018-01-28 DIAGNOSIS — J069 Acute upper respiratory infection, unspecified: Secondary | ICD-10-CM

## 2018-01-28 HISTORY — DX: Essential (primary) hypertension: I10

## 2018-01-28 MED ORDER — HYDROCOD POLST-CPM POLST ER 10-8 MG/5ML PO SUER: 5.0000 mL | Freq: Every evening | ORAL | 0 refills | Status: DC | PRN

## 2018-01-28 MED ORDER — DOXYCYCLINE HYCLATE 100 MG PO CAPS: 100.0000 mg | ORAL_CAPSULE | Freq: Two times a day (BID) | ORAL | 0 refills | Status: DC

## 2018-01-28 NOTE — Discharge Instructions (Addendum)
This is likely viral.  Use the cough medication at night. No nyquil.  Antibiotic if you fail to improve or worsen.  Take care  Dr. Lacinda Axon   Dr

## 2018-01-28 NOTE — ED Triage Notes (Signed)
Pt reports Friday and Saturday was weak, chills, and cough. Felt better on Sunday and was working in the yard. Mostly concerned due to his cough which he thinks is bronchitis and has in the past.

## 2018-01-28 NOTE — ED Provider Notes (Signed)
[facility]    CSN: [id number] Arrival date & time: 01/28/18  1826  History   Chief Complaint Chief Complaint  Patient presents with  . Cough   HPI  52 year old male presents with cough.  Patient states that he has been sick since Friday.  He has had subjective fever, headache, nausea and vomiting.  Associated weakness, fatigue, decrease in appetite.  He is also had cough.  His fever and headache as well as his nausea vomiting has resolved.  He still feels weak but is slightly improved.  However, he continues to be plagued by cough.  Cough is productive at times.  Associated shortness of breath.  No known exacerbating factors.  He is taken NyQuil without a tremendous improvement.  No other associated symptoms.  No other complaints.  Past Medical History:  Diagnosis Date  . Bronchitis    OFF AND ON FROM SUMMER TO OCTOBER 2016-RESOLVED NOW  . Hodgkin disease ([place]) 1978  . Hypertension     Patient Active Problem List   Diagnosis Date Noted  . Gallstone 09/22/2015  . Calculus of gallbladder without cholecystitis without obstruction   . RUQ pain     Past Surgical History:  Procedure Laterality Date  . CHOLECYSTECTOMY N/A 10/08/2015   Procedure: LAPAROSCOPIC CHOLECYSTECTOMY  With cholangiogram;  Surgeon: Robert Bellow, MD;  Location: [facility] ORS;  Service: General;  Laterality: N/A;  . HERNIA REPAIR    . SPLENECTOMY, TOTAL     Home Medications    Prior to Admission medications   Medication Sig Start Date End Date Taking? Authorizing Provider  chlorpheniramine-HYDROcodone (TUSSIONEX PENNKINETIC ER) 10-8 MG/5ML SUER Take 5 mLs by mouth at bedtime as needed. 01/28/18   Coral Spikes, DO  doxycycline (VIBRAMYCIN) 100 MG capsule Take 1 capsule (100 mg total) by mouth 2 (two) times daily. 01/28/18   Coral Spikes, DO  losartan (COZAAR) 100 MG tablet losartan 100 mg tablet    [provider]  metoprolol succinate (TOPROL-XL) 50 MG 24 hr tablet metoprolol  succinate ER 50 mg tablet,extended release 24 hr    [provider]   Family History Family History  Problem Relation Age of Onset  . Lung disease Father   . Diabetes Mother    Social History Social History   Tobacco Use  . Smoking status: Never Smoker  . Smokeless tobacco: Never Used  Substance Use Topics  . Alcohol use: No    Alcohol/week: 0.0 oz  . Drug use: No   Allergies   Penicillins  Review of Systems Review of Systems  Constitutional: Positive for appetite change, fatigue and fever.  Respiratory: Positive for cough and shortness of breath.   Gastrointestinal: Positive for nausea and vomiting.   Physical Exam Triage Vital Signs ED Triage Vitals  Enc Vitals Group     BP 01/28/18 1840 (!) 163/113     Pulse Rate 01/28/18 1840 83     Resp 01/28/18 1840 18     Temp 01/28/18 1840 98.7 F (37.1 C)     Temp Source 01/28/18 1840 Oral     SpO2 01/28/18 1840 99 %     Weight 01/28/18 1842 202 lb (91.6 kg)     Height 01/28/18 1842 6\' 1"  (1.854 m)     Head Circumference --      Peak Flow --      Pain Score 01/28/18 1842 0     Pain Loc --      Pain Edu? --  Excl. in [place]? --    Updated Vital Signs BP (!) 163/113 (BP Location: Right Arm) Comment: Has been off his b/p meds past few days but did take one today  Pulse 83   Temp 98.7 F (37.1 C) (Oral)   Resp 18   Ht 6\' 1"  (1.854 m)   Wt 202 lb (91.6 kg)   SpO2 99%   BMI 26.65 kg/m  Physical Exam  Constitutional: He is oriented to person, place, and time. He appears well-developed. No distress.  HENT:  Head: Normocephalic and atraumatic.  Mouth/Throat: Oropharynx is clear and moist.  Cardiovascular: Normal rate and regular rhythm.  Pulmonary/Chest: Effort normal and breath sounds normal. He has no wheezes. He has no rales.  Neurological: He is alert and oriented to person, place, and time.  Psychiatric: He has a normal mood and affect. His behavior is normal.  Nursing note and vitals reviewed.  UC  Treatments / Results  Labs (all labs ordered are listed, but only abnormal results are displayed) Labs Reviewed - No data to display  EKG None Radiology No results found.  Procedures Procedures (including critical care time)  Medications Ordered in UC Medications - No data to display   Initial Impression / Assessment and Plan / UC Course  I have reviewed the triage vital signs and the nursing notes.  Pertinent labs & imaging results that were available during my care of the patient were reviewed by me and considered in my medical decision making (see chart for details).     52 year old male presents with a viral illness.  Now plagued by cough.  Treating with Tussionex.  Wait-and-see prescription for doxycycline as he has a history of prolonged bout of bronchitis.  Final Clinical Impressions(s) / UC Diagnoses   Final diagnoses:  Viral URI with cough    ED Discharge Orders        Ordered    chlorpheniramine-HYDROcodone (TUSSIONEX PENNKINETIC ER) 10-8 MG/5ML SUER  At bedtime PRN     01/28/18 1905    doxycycline (VIBRAMYCIN) 100 MG capsule  2 times daily     01/28/18 1905     Controlled Substance Prescriptions [place] Controlled Substance Registry consulted? Not Applicable   Coral Spikes, DO 01/28/18 1930

## 2018-06-20 DIAGNOSIS — R7301 Impaired fasting glucose: Secondary | ICD-10-CM | POA: Insufficient documentation

## 2018-07-12 ENCOUNTER — Other Ambulatory Visit: Payer: Self-pay | Admitting: Otolaryngology

## 2018-07-12 DIAGNOSIS — E079 Disorder of thyroid, unspecified: Secondary | ICD-10-CM

## 2018-07-22 ENCOUNTER — Ambulatory Visit
Admission: RE | Admit: 2018-07-22 | Discharge: 2018-07-22 | Disposition: A | Discharge status: Home / Self Care | Payer: Federal, State, Local not specified - PPO | Source: Ambulatory Visit | Attending: Otolaryngology | Admitting: Otolaryngology

## 2018-07-22 DIAGNOSIS — E041 Nontoxic single thyroid nodule: Secondary | ICD-10-CM | POA: Insufficient documentation

## 2018-07-22 DIAGNOSIS — E079 Disorder of thyroid, unspecified: Secondary | ICD-10-CM | POA: Insufficient documentation

## 2018-10-22 DIAGNOSIS — K760 Fatty (change of) liver, not elsewhere classified: Secondary | ICD-10-CM | POA: Insufficient documentation

## 2019-04-17 ENCOUNTER — Other Ambulatory Visit: Payer: Self-pay | Admitting: Otolaryngology

## 2019-04-17 DIAGNOSIS — E041 Nontoxic single thyroid nodule: Secondary | ICD-10-CM

## 2019-04-25 ENCOUNTER — Other Ambulatory Visit: Payer: Self-pay

## 2019-04-25 ENCOUNTER — Ambulatory Visit
Admission: RE | Admit: 2019-04-25 | Discharge: 2019-04-25 | Disposition: A | Discharge status: Home / Self Care | Payer: Federal, State, Local not specified - PPO | Source: Ambulatory Visit | Attending: Otolaryngology | Admitting: Otolaryngology

## 2019-04-25 DIAGNOSIS — E041 Nontoxic single thyroid nodule: Secondary | ICD-10-CM | POA: Insufficient documentation

## 2019-04-30 DIAGNOSIS — E663 Overweight: Secondary | ICD-10-CM

## 2019-04-30 HISTORY — DX: Overweight: E66.3

## 2019-05-12 ENCOUNTER — Encounter: Payer: Self-pay | Admitting: Otolaryngology

## 2019-05-12 LAB — CYTOLOGY - NON PAP

## 2019-05-13 ENCOUNTER — Other Ambulatory Visit: Payer: Self-pay | Admitting: Otolaryngology

## 2019-05-13 DIAGNOSIS — E041 Nontoxic single thyroid nodule: Secondary | ICD-10-CM

## 2019-07-30 ENCOUNTER — Ambulatory Visit: Payer: Federal, State, Local not specified - PPO

## 2019-11-05 DIAGNOSIS — M545 Low back pain, unspecified: Secondary | ICD-10-CM | POA: Insufficient documentation

## 2020-02-03 ENCOUNTER — Ambulatory Visit: Payer: Federal, State, Local not specified - PPO

## 2020-08-03 ENCOUNTER — Other Ambulatory Visit: Payer: Self-pay

## 2020-08-03 ENCOUNTER — Encounter: Payer: Self-pay | Admitting: Urology

## 2020-08-03 ENCOUNTER — Ambulatory Visit: Payer: Federal, State, Local not specified - PPO | Admitting: Urology

## 2020-08-03 VITALS — BP 129/72 | HR 71 | Ht 74.0 in | Wt 200.0 lb

## 2020-08-03 DIAGNOSIS — R972 Elevated prostate specific antigen [PSA]: Secondary | ICD-10-CM

## 2020-08-03 DIAGNOSIS — R350 Frequency of micturition: Secondary | ICD-10-CM

## 2020-08-03 NOTE — Progress Notes (Signed)
08/03/20 10:27 AM   Skipper Cliche 1966/07/16 [id number]  CC: Elevated PSA  HPI: I saw Mr. Nanda in urology clinic for elevated PSA. He is a healthy 54 year old male with a distant history of Hodgkin's lymphoma as a teenager who presents with multiple elevated PSAs. He had a PSA of 6.2 and 6.9 in 2019 and 2020, followed by recent repeat PSA that remained elevated at 7.7. Reflex to free was not performed on any of the PSA values. He was seen by [facility] in October 2021 for elevated PSA, and they recommended a transrectal ultrasound-guided prostate biopsy. He did not have a good experience there, and did not follow-up. DRE at that visit was benign. I personally reviewed the outside notes from [facility]. He is here today for second opinion regarding his elevated PSA. He thinks his father may have had prostate cancer, but he is unsure.   He occasionally will have some urinary frequency, but denies any gross hematuria, weak urinary stream, or history of urinary retention.  He has done quite a bit of research regarding PSA screening, and he feels his elevated PSA is related to motorcycle riding and DRE being performed prior to PSA testing.   PMH: Past Medical History:  Diagnosis Date  . Bronchitis    OFF AND ON FROM SUMMER TO OCTOBER 2016-RESOLVED NOW  . Hodgkin disease ([place]) 1978  . Hypertension     Surgical History: Past Surgical History:  Procedure Laterality Date  . CHOLECYSTECTOMY N/A 10/08/2015   Procedure: LAPAROSCOPIC CHOLECYSTECTOMY  With cholangiogram;  Surgeon: Robert Bellow, MD;  Location: [facility] ORS;  Service: General;  Laterality: N/A;  . HERNIA REPAIR    . SPLENECTOMY, TOTAL      Family History: Family History  Problem Relation Age of Onset  . Lung disease Father   . Diabetes Mother     Social History:  reports that he has never smoked. He has never used smokeless tobacco. He reports that he does not drink alcohol and does not use drugs.  Physical Exam: BP 129/72 (BP  Location: Left Arm, Patient Position: Sitting, Cuff Size: Normal)   Pulse 71   Ht 6\' 2"  (1.88 m)   Wt 200 lb (90.7 kg)   BMI 25.68 kg/m    Constitutional:  Alert and oriented, No acute distress. Cardiovascular: No clubbing, cyanosis, or edema. Respiratory: Normal respiratory effort, no increased work of breathing.  Laboratory Data: See HPI  Pertinent Imaging: None to review  Assessment & Plan:   In summary, he is a healthy 54 year old male with a possible family history of prostate cancer in his father who has had a persistently rising PSA over the last 2 years, most recently 7.7.  We reviewed the implications of an elevated PSA and the uncertainty surrounding it. In general, a man's PSA increases with age and is produced by both normal and cancerous prostate tissue. The differential diagnosis for elevated PSA includes BPH, prostate cancer, infection, recent intercourse/ejaculation, recent urethroscopic manipulation (foley placement/cystoscopy) or trauma, and prostatitis.   Management of an elevated PSA can include observation or prostate biopsy and we discussed this in detail. Our goal is to detect clinically significant prostate cancers, and manage with either active surveillance, surgery, or radiation for localized disease. Risks of prostate biopsy include bleeding, infection (including life threatening sepsis), pain, and lower urinary symptoms. Hematuria, hematospermia, and blood in the stool are all common after biopsy and can persist up to 4 weeks.   We discussed alternative options in lieu of  a prostate biopsy including prostate MRI, or repeat PSA in 3 to 4 months with reflex to free. With his young age and persistently rising PSA, I strongly recommended either prostate biopsy or prostate MRI. He is amenable to a prostate MRI. We discussed the need for biopsy in the future if prostate MRI showed any worrisome lesions, as well as the 10-15% false-negative rate of both prostate MRI and  prostate biopsy.  Prostate MRI ordered RTC 4 to 6 weeks to review prostate MRI results, and for IPSS/PVR  Nickolas Madrid, MD 08/03/2020  [facility] [address] [place], [place] [address] [phone]

## 2020-08-03 NOTE — Patient Instructions (Signed)
Prostate Cancer Screening  Prostate cancer screening is a test that is done to check for the presence of prostate cancer in men. The prostate gland is a walnut-sized gland that is located below the bladder and in front of the rectum in males. The function of the prostate is to add fluid to semen during ejaculation. Prostate cancer is the second most common type of cancer in men. Who should have prostate cancer screening?  Screening recommendations vary based on age and other risk factors. Screening is recommended if:  You are older than age 55. If you are age 55-69, talk with your health care provider about your need for screening and how often screening should be done. Because most prostate cancers are slow growing and will not cause death, screening is generally reserved in this age group for men who have a 10-15-year life expectancy.  You are younger than age 55, and you have these risk factors: ? Being a black male or a male of African descent. ? Having a father, brother, or uncle who has been diagnosed with prostate cancer. The risk is higher if your family member's cancer occurred at an early age. Screening is not recommended if:  You are younger than age 40.  You are between the ages of 40 and 54 and you have no risk factors.  You are 70 years of age or older. At this age, the risks that screening can cause are greater than the benefits that it may provide. If you are at high risk for prostate cancer, your health care provider may recommend that you have screenings more often or that you start screening at a younger age. How is screening for prostate cancer done? The recommended prostate cancer screening test is a blood test called the prostate-specific antigen (PSA) test. PSA is a protein that is made in the prostate. As you age, your prostate naturally produces more PSA. Abnormally high PSA levels may be caused by:  Prostate cancer.  An enlarged prostate that is not caused by cancer  (benign prostatic hyperplasia, BPH). This condition is very common in older men.  A prostate gland infection (prostatitis). Depending on the PSA results, you may need more tests, such as:  A physical exam to check the size of your prostate gland.  Blood and imaging tests.  A procedure to remove tissue samples from your prostate gland for testing (biopsy). What are the benefits of prostate cancer screening?  Screening can help to identify cancer at an early stage, before symptoms start and when the cancer can be treated more easily.  There is a small chance that screening may lower your risk of dying from prostate cancer. The chance is small because prostate cancer is a slow-growing cancer, and most men with prostate cancer die from a different cause. What are the risks of prostate cancer screening? The main risk of prostate cancer screening is diagnosing and treating prostate cancer that would never have caused any symptoms or problems. This is called overdiagnosisand overtreatment. PSA screening cannot tell you if your PSA is high due to cancer or a different cause. A prostate biopsy is the only procedure to diagnose prostate cancer. Even the results of a biopsy may not tell you if your cancer needs to be treated. Slow-growing prostate cancer may not need any treatment other than monitoring, so diagnosing and treating it may cause unnecessary stress or other side effects. A prostate biopsy may also cause:  Infection or fever.  A false negative. This is   a result that shows that you do not have prostate cancer when you actually do have prostate cancer. Questions to ask your health care provider  When should I start prostate cancer screening?  What is my risk for prostate cancer?  How often do I need screening?  What type of screening tests do I need?  How do I get my test results?  What do my results mean?  Do I need treatment? Where to find more information  The American Cancer  Society: www.cancer.org  American Urological Association: www.auanet.org Contact a health care provider if:  You have difficulty urinating.  You have pain when you urinate or ejaculate.  You have blood in your urine or semen.  You have pain in your back or in the area of your prostate. Summary  Prostate cancer is a common type of cancer in men. The prostate gland is located below the bladder and in front of the rectum. This gland adds fluid to semen during ejaculation.  Prostate cancer screening may identify cancer at an early stage, when the cancer can be treated more easily.  The prostate-specific antigen (PSA) test is the recommended screening test for prostate cancer.  Discuss the risks and benefits of prostate cancer screening with your health care provider. If you are age 70 or older, the risks that screening can cause are greater than the benefits that it may provide. This information is not intended to replace advice given to you by your health care provider. Make sure you discuss any questions you have with your health care provider. Document Revised: 05/15/2019 Document Reviewed: 05/15/2019 Elsevier Patient Education  2020 Elsevier Inc.  

## 2020-08-25 ENCOUNTER — Other Ambulatory Visit: Payer: Self-pay

## 2020-08-25 ENCOUNTER — Ambulatory Visit (HOSPITAL_COMMUNITY)
Admission: RE | Admit: 2020-08-25 | Discharge: 2020-08-25 | Disposition: A | Discharge status: Home / Self Care | Payer: Federal, State, Local not specified - PPO | Source: Ambulatory Visit | Attending: Urology | Admitting: Urology

## 2020-08-25 DIAGNOSIS — R972 Elevated prostate specific antigen [PSA]: Secondary | ICD-10-CM | POA: Insufficient documentation

## 2020-08-25 MED ORDER — GADOBUTROL 1 MMOL/ML IV SOLN
10.0000 mL | Freq: Once | INTRAVENOUS | Status: AC | PRN
  Administered 2020-08-25: 10 mL via INTRAVENOUS

## 2020-09-14 ENCOUNTER — Encounter: Payer: Self-pay | Admitting: Urology

## 2020-09-14 ENCOUNTER — Ambulatory Visit: Payer: Federal, State, Local not specified - PPO | Admitting: Urology

## 2020-09-14 ENCOUNTER — Other Ambulatory Visit: Payer: Self-pay

## 2020-09-14 VITALS — BP 120/70 | HR 68 | Ht 74.0 in | Wt 200.0 lb

## 2020-09-14 DIAGNOSIS — R972 Elevated prostate specific antigen [PSA]: Secondary | ICD-10-CM | POA: Diagnosis not present

## 2020-09-14 DIAGNOSIS — Z125 Encounter for screening for malignant neoplasm of prostate: Secondary | ICD-10-CM | POA: Diagnosis not present

## 2020-09-14 DIAGNOSIS — R350 Frequency of micturition: Secondary | ICD-10-CM | POA: Diagnosis not present

## 2020-09-14 LAB — BLADDER SCAN AMB NON-IMAGING: Scan Result: 16

## 2020-09-14 NOTE — Progress Notes (Signed)
   09/14/2020 10:43 AM   Albert Meyer 1966-03-28 [id number]  Reason for visit: Review prostate MRI results, urinary symptoms/BPH  HPI: I saw Albert Meyer back in urology clinic to review his prostate MRI results and discuss BPH.  He is a healthy 54 year old male who had multiple elevated PSAs of 6.2, 6.9, and 7.7, and a prostate biopsy was recommended by an outside urologist.  He sought a second opinion here.  We discussed the risks and benefits at length, and ultimately he opted for a prostate MRI.  He has mild urinary symptoms of occasional urinary frequency.  IPSS score today is 2, with quality of life mostly satisfied.  I personally viewed and interpreted his prostate MRI dated 08/25/2020.  This shows no suspicious PI-RADS lesions, and a 60 g enlarged prostate with nodularity of the transition zone compatible with BPH.  We reviewed these results at length.  We discussed the small, but not zero, risk of missing a clinically significant malignancy by deferring biopsy, and the importance of continuing PSA screening moving forward.  He rides a motorcycle, which may contribute to his mildly elevated PSA.  PSA density is also reassuring at 0.13.  RTC 1 year with PSA reflex to free prior   Billey Co, MD  [facility] [address] [place], [place] [address] [phone]

## 2020-09-14 NOTE — Patient Instructions (Signed)
Prostate Cancer Screening  Prostate cancer screening is a test that is done to check for the presence of prostate cancer in men. The prostate gland is a walnut-sized gland that is located below the bladder and in front of the rectum in males. The function of the prostate is to add fluid to semen during ejaculation. Prostate cancer is the second most common type of cancer in men. Who should have prostate cancer screening?  Screening recommendations vary based on age and other risk factors. Screening is recommended if:  You are older than age 55. If you are age 55-69, talk with your health care provider about your need for screening and how often screening should be done. Because most prostate cancers are slow growing and will not cause death, screening is generally reserved in this age group for men who have a 10-15-year life expectancy.  You are younger than age 55, and you have these risk factors: ? Being a black male or a male of African descent. ? Having a father, brother, or uncle who has been diagnosed with prostate cancer. The risk is higher if your family member's cancer occurred at an early age. Screening is not recommended if:  You are younger than age 40.  You are between the ages of 40 and 54 and you have no risk factors.  You are 70 years of age or older. At this age, the risks that screening can cause are greater than the benefits that it may provide. If you are at high risk for prostate cancer, your health care provider may recommend that you have screenings more often or that you start screening at a younger age. How is screening for prostate cancer done? The recommended prostate cancer screening test is a blood test called the prostate-specific antigen (PSA) test. PSA is a protein that is made in the prostate. As you age, your prostate naturally produces more PSA. Abnormally high PSA levels may be caused by:  Prostate cancer.  An enlarged prostate that is not caused by cancer  (benign prostatic hyperplasia, BPH). This condition is very common in older men.  A prostate gland infection (prostatitis). Depending on the PSA results, you may need more tests, such as:  A physical exam to check the size of your prostate gland.  Blood and imaging tests.  A procedure to remove tissue samples from your prostate gland for testing (biopsy). What are the benefits of prostate cancer screening?  Screening can help to identify cancer at an early stage, before symptoms start and when the cancer can be treated more easily.  There is a small chance that screening may lower your risk of dying from prostate cancer. The chance is small because prostate cancer is a slow-growing cancer, and most men with prostate cancer die from a different cause. What are the risks of prostate cancer screening? The main risk of prostate cancer screening is diagnosing and treating prostate cancer that would never have caused any symptoms or problems. This is called overdiagnosisand overtreatment. PSA screening cannot tell you if your PSA is high due to cancer or a different cause. A prostate biopsy is the only procedure to diagnose prostate cancer. Even the results of a biopsy may not tell you if your cancer needs to be treated. Slow-growing prostate cancer may not need any treatment other than monitoring, so diagnosing and treating it may cause unnecessary stress or other side effects. A prostate biopsy may also cause:  Infection or fever.  A false negative. This is   a result that shows that you do not have prostate cancer when you actually do have prostate cancer. Questions to ask your health care provider  When should I start prostate cancer screening?  What is my risk for prostate cancer?  How often do I need screening?  What type of screening tests do I need?  How do I get my test results?  What do my results mean?  Do I need treatment? Where to find more information  The American Cancer  Society: www.cancer.org  American Urological Association: www.auanet.org Contact a health care provider if:  You have difficulty urinating.  You have pain when you urinate or ejaculate.  You have blood in your urine or semen.  You have pain in your back or in the area of your prostate. Summary  Prostate cancer is a common type of cancer in men. The prostate gland is located below the bladder and in front of the rectum. This gland adds fluid to semen during ejaculation.  Prostate cancer screening may identify cancer at an early stage, when the cancer can be treated more easily.  The prostate-specific antigen (PSA) test is the recommended screening test for prostate cancer.  Discuss the risks and benefits of prostate cancer screening with your health care provider. If you are age 70 or older, the risks that screening can cause are greater than the benefits that it may provide. This information is not intended to replace advice given to you by your health care provider. Make sure you discuss any questions you have with your health care provider. Document Revised: 05/15/2019 Document Reviewed: 05/15/2019 Elsevier Patient Education  2020 Elsevier Inc.  

## 2020-11-17 DIAGNOSIS — N400 Enlarged prostate without lower urinary tract symptoms: Secondary | ICD-10-CM | POA: Insufficient documentation

## 2020-11-17 HISTORY — DX: Benign prostatic hyperplasia without lower urinary tract symptoms: N40.0

## 2020-11-19 IMAGING — MR MR PROSTATE WO/W CM
13 series · 48 of 48 positions shown · IV contrast (gadavist)
Comparison: None.

CLINICAL DATA: Elevated PSA level of 7.7 recently.

EXAM:
MR PROSTATE WITHOUT AND WITH CONTRAST
TECHNIQUE: Multiplanar multisequence MRI images were obtained of the pelvis
centered about the prostate. Pre and post contrast images were
obtained.
CONTRAST:  10mL GADAVIST GADOBUTROL 1 MMOL/ML IV SOLN

[Series 2: (email) · axial · 5.0mm · 1.56mm/px · 1 of 17 slices shown]
[im 1/17]
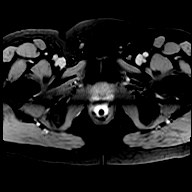

[Series 3: T1 · axial · 6.0mm · 0.86mm/px · 1 of 39 slices shown (1 of 2)]
[im 1/39]
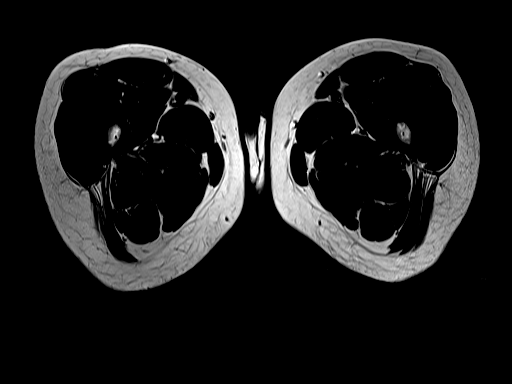

[Series 4: axial whole pelvis · axial · 6.0mm · 0.84mm/px · 1 of 41 slices shown]
[im 1/41]
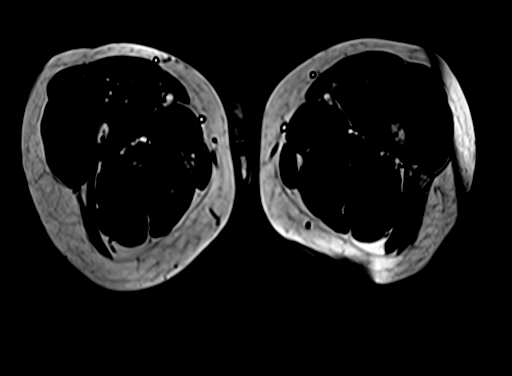

[Series 5: T2 · axial · 3.0mm · 0.47mm/px · 1 of 29 slices shown (1 of 4)]
[im 1/29]
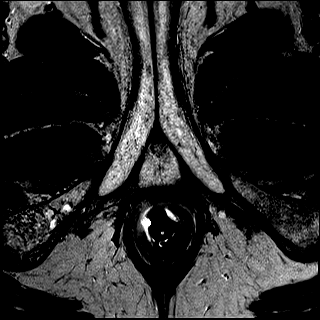

[Series 6: T2 · coronal · 4.0mm · 0.47mm/px · 1 of 24 slices shown (2 of 4)]
[im 1/24]
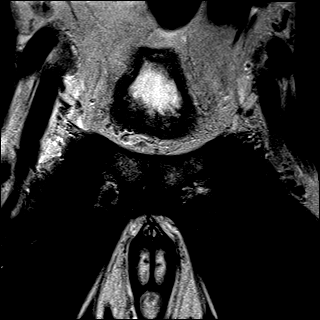

[Series 7: T2 · sagittal · 3.5mm · 0.47mm/px · 1 of 26 slices shown (3 of 4)]
[im 1/26]
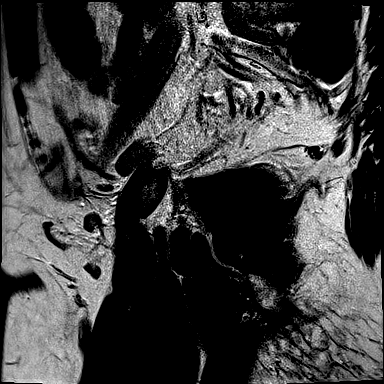

[Series 8: T1 · axial · 3.0mm · 0.50mm/px · 1 of 29 slices shown (2 of 2)]
[im 1/29]
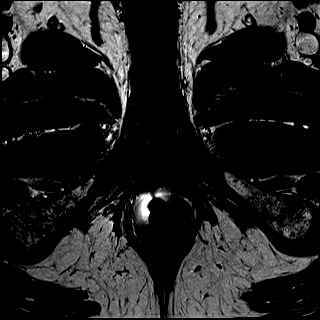

[Series 10: T2 · axial · 1.2mm · 1.00mm/px · z∈[-71,+23]mm · 2 of 80 slices shown (4 of 4)]
[im 1/80]
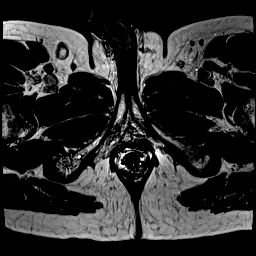
[im 80/80]
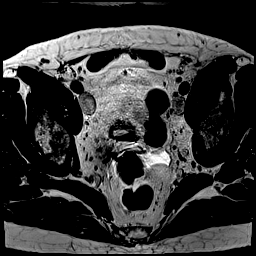

[Series 11: ep2d_diff_b50_400_800_tra_endo**_tracew_dfc_mix · axial · 3.0mm · 1.60mm/px · z∈[-71,+11]mm · 2 of 75 slices shown]
[im 1/75]
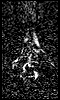
[im 75/75]
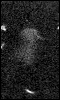

[Series 12: ep2d_diff_b50_400_800_tra_endo**_adc_dfc_mix · axial · 3.0mm · 1.60mm/px · 1 of 25 slices shown]
[im 1/25]
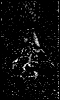

[Series 13: ep2d_diff_b50_400_800_tra_endo**_calc_bval_dfc_mix · axial · 3.0mm · 1.60mm/px · 1 of 23 slices shown]
[im 1/23]
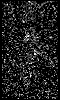

[Series 14: t1_vibe_tra_dyn · axial · 3.0mm · 0.98mm/px · z∈[-81,+2]mm · 18 of 600 slices shown]
[im 1/600]
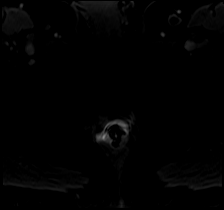
[im 36/600]
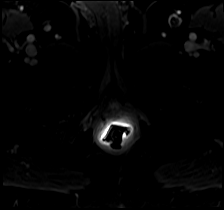
[im 71/600]
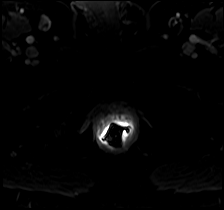
[im 106/600]
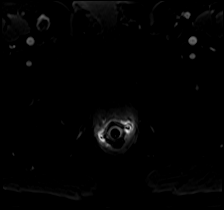
[im 141/600]
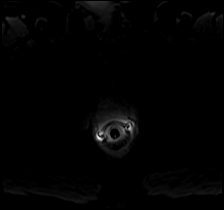
[im 177/600]
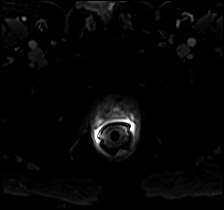
[im 212/600]
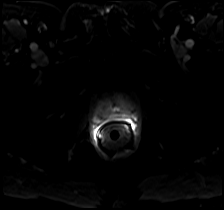
[im 247/600]
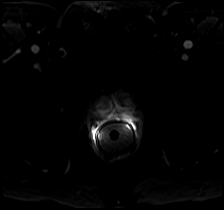
[im 282/600]
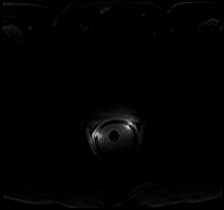
[im 318/600]
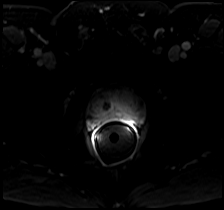
[im 353/600]
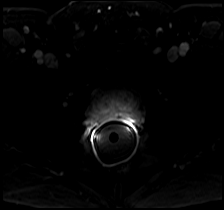
[im 388/600]
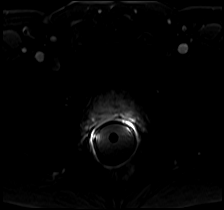
[im 423/600]
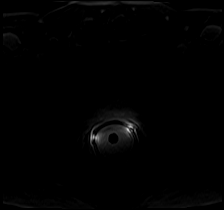
[im 459/600]
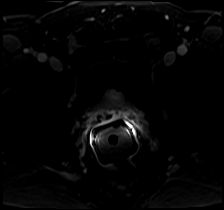
[im 494/600]
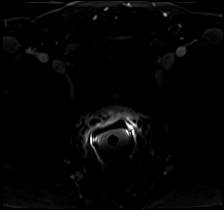
[im 529/600]
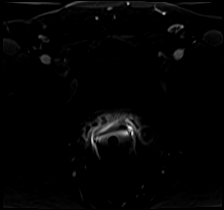
[im 564/600]
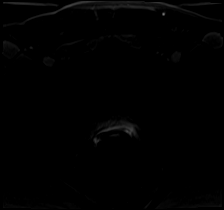
[im 600/600]
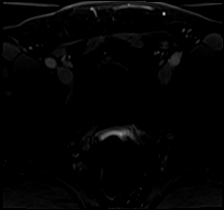

[Series 15: t1_vibe_tra_dyn_sub · axial · 3.0mm · 0.98mm/px · z∈[-81,+2]mm · 17 of 566 slices shown]
[im 1/566]
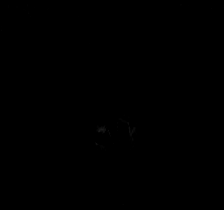
[im 36/566]
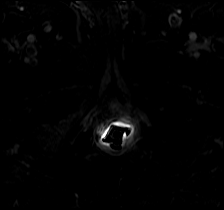
[im 71/566]
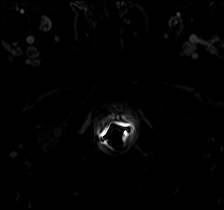
[im 106/566]
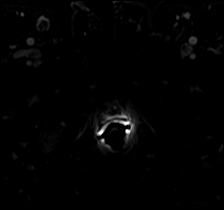
[im 142/566]
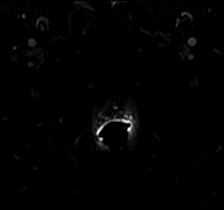
[im 177/566]
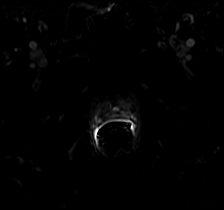
[im 212/566]
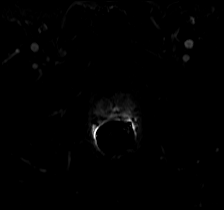
[im 248/566]
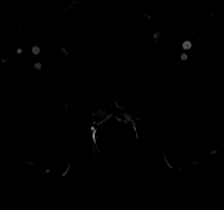
[im 283/566]
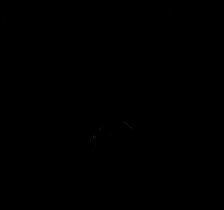
[im 318/566]
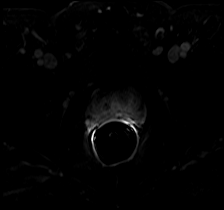
[im 354/566]
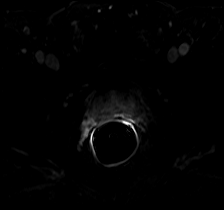
[im 389/566]
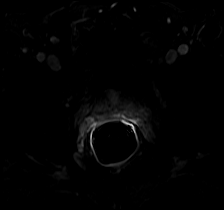
[im 424/566]
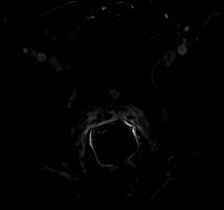
[im 460/566]
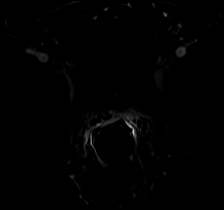
[im 495/566]
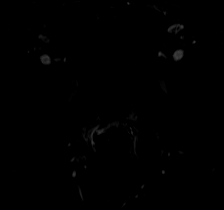
[im 530/566]
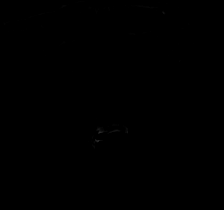
[im 566/566]
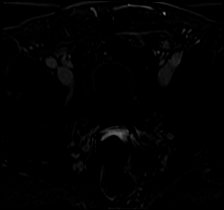

[48 of 48 positions shown; findings below may reference images not displayed]

FINDINGS: Prostate: Encapsulated nodularity in the transition zones compatible
with benign prostatic hypertrophy.

Scattered low T2 signal stranding throughout much of the peripheral
zone with associated generalized enhancement in the peripheral zone,
likely postinflammatory and considered PI-RADS category 2.

No significant restriction of diffusion is identified. No compelling
indicators of active malignancy within the prostate gland.

Volume: 3D volumetric analysis: Prostate volume 59.51 cubic cm (6.1
by 4.0 by 5.5 cm).

Transcapsular spread:  Absent

Seminal vesicle involvement: Absent

Neurovascular bundle involvement: Absent

Pelvic adenopathy: 1.0 cm right external iliac node on image 6 of
series [DATE] cm left external iliac lymph node, image 8 of series
5.

Bone metastasis: Absent

Other findings: No supplemental non-categorized findings.
IMPRESSION: 1. No findings of intermediate or higher suspicion for prostate
cancer.
2. Bilateral borderline sized external iliac lymph nodes, measuring
up to 1.0 cm on the right and 0.9 cm on the left. These are
nonspecific and may be reactive.
3. Prostatomegaly with prostate volume 59.51 cubic cm. Encapsulated
nodularity in the transition zones compatible with benign prostatic
hypertrophy.

## 2021-03-15 ENCOUNTER — Ambulatory Visit: Payer: Federal, State, Local not specified - PPO | Admitting: Urology

## 2021-03-22 ENCOUNTER — Other Ambulatory Visit: Payer: Federal, State, Local not specified - PPO

## 2021-03-22 ENCOUNTER — Other Ambulatory Visit: Payer: Self-pay

## 2021-03-22 ENCOUNTER — Ambulatory Visit: Payer: Federal, State, Local not specified - PPO | Admitting: Urology

## 2021-03-22 DIAGNOSIS — R972 Elevated prostate specific antigen [PSA]: Secondary | ICD-10-CM

## 2021-03-23 ENCOUNTER — Telehealth: Payer: Self-pay

## 2021-03-23 DIAGNOSIS — R972 Elevated prostate specific antigen [PSA]: Secondary | ICD-10-CM

## 2021-03-23 LAB — FPSA% REFLEX
% FREE PSA: 22 %
PSA, FREE: 1.85 ng/mL

## 2021-03-23 LAB — PSA TOTAL (REFLEX TO FREE): Prostate Specific Ag, Serum: 8.4 ng/mL — ABNORMAL HIGH (ref 0.0–4.0)

## 2021-03-23 NOTE — Telephone Encounter (Signed)
-----   Message from Billey Co, MD sent at 03/23/2021  8:32 AM EDT ----- PSA remains mildly elevated, but reassuring percentage free indicating this is likely just from an enlarged prostate, not cancer.  Please schedule follow-up in 4 months with PSA, reflex to free prior  Nickolas Madrid, MD 03/23/2021

## 2021-03-23 NOTE — Telephone Encounter (Signed)
Called pt no answer. Left detailed message for pt per DPR informing him of the information below. Advised pt to call back for questions or concerns. PSA ordered. Appt scheduled.

## 2021-07-26 ENCOUNTER — Ambulatory Visit: Payer: Federal, State, Local not specified - PPO | Admitting: Urology

## 2021-07-26 ENCOUNTER — Other Ambulatory Visit: Payer: Self-pay

## 2021-07-26 ENCOUNTER — Other Ambulatory Visit
Admission: RE | Admit: 2021-07-26 | Discharge: 2021-07-26 | Disposition: A | Discharge status: Home / Self Care | Payer: Federal, State, Local not specified - PPO | Attending: Urology | Admitting: Urology

## 2021-07-26 ENCOUNTER — Encounter: Payer: Self-pay | Admitting: Urology

## 2021-07-26 ENCOUNTER — Other Ambulatory Visit: Payer: Self-pay | Admitting: *Deleted

## 2021-07-26 VITALS — BP 127/81 | HR 80 | Ht 74.0 in | Wt 203.0 lb

## 2021-07-26 DIAGNOSIS — R972 Elevated prostate specific antigen [PSA]: Secondary | ICD-10-CM

## 2021-07-26 NOTE — Progress Notes (Signed)
   07/26/2021 1:06 PM   Albert Meyer Jan 15, 1966 [id number]  Reason for visit: Follow up elevated PSA  HPI: 55 year old male with long history of elevated PSA of 6.2, 6.9, 7.7, and most recently 8.4(22% free) in June 2022.  Biopsy was previously recommended by different urology provider, and he sought a second opinion with [place] here in [place].  With his refusal for biopsy, we ultimately opted for a prostate MRI which showed no suspicious lesions and 60 g prostate.  We again reviewed the guidelines regarding PSA screening and the risk of benefits of deferring biopsy.  We reviewed that based on the numbers he would have approximately 10% chance of prostate cancer based on the reassuring 22% free on recent reflex to free PSA.  I again offered biopsy, and he would like to defer.  He understands the risks of missing a clinically significant prostate cancer.  RTC 1 year PSA reflex to free, consider 4K score if PSA is persistently rising  Billey Co, MD  [facility] [address] [place], [place] [address] [phone]

## 2021-07-26 NOTE — Patient Instructions (Signed)
Prostate-Specific Antigen Test Why am I having this test? The prostate-specific antigen (PSA) test is a screening test for prostate cancer. It can identify early signs of prostate cancer, which may allow for more effective treatment. Your health care provider may recommend that you have a PSA test starting at age 56 or that you have one earlier or later, depending on your risk factors for prostate cancer. You may also have a PSA test: To monitor treatment of prostate cancer. To check whether prostate cancer has returned after treatment. If you have signs of other conditions that can affect PSA levels, such as: An enlarged prostate that is not caused by cancer (benign prostatic hyperplasia, BPH). This condition is very common in older men. A prostate infection. What is being tested? This test measures the amount of PSA in your blood. PSA is a protein that is made in the prostate. The prostate naturally produces more PSA as you age, but very high levels may be a sign of a medical condition. What kind of sample is taken? A blood sample is required for this test. It is usually collected by inserting a needle into a blood vessel or by sticking a finger with a small needle. Blood for this test should be drawn before having an exam of the prostate. How do I prepare for this test? Do not ejaculate starting 24 hours before your test, or as long as told by your health care provider. Tell a health care provider about: Any allergies you have. All medicines you are taking, including vitamins, herbs, eye drops, creams, and over-the-counter medicines. This also includes: Medicines to assist with hair growth, such as finasteride. Any recent exposure to a medicine called diethylstilbestrol. Any blood disorders you have. Any recent procedures you have had, especially any procedures involving the prostate or rectum. Any medical conditions you have. Any recent urinary tract infections (UTIs) you have had. How are  the results reported? Your test results will be reported as a value that indicates how much PSA is in your blood. This will be given as nanograms of PSA per milliliter of blood (ng/mL). Your health care provider will compare your results to normal ranges that were established after testing a large group of people (reference ranges). Reference ranges may vary among labs and hospitals. PSA levels vary from person to person and generally increase with age. Because of this variation, there is no single PSA value that is considered normal for everyone. Instead, PSA reference ranges are used to describe whether your PSA levels are considered low or high (elevated). Common reference ranges are: Low: 0-2.5 ng/mL. Slightly to moderately elevated: 2.6-10.0 ng/mL. Moderately elevated: 10.0-19.9 ng/mL. Significantly elevated: 20 ng/mL or greater. Sometimes, the test results may report that a condition is present when it is not present (false-positive result). What do the results mean? A test result that is higher than 4 ng/mL may mean that you are at an increased risk for prostate cancer. However, a PSA test by itself is not enough to diagnose prostate cancer. High PSA levels may also be caused by the natural aging process, prostate infection, or BPH. PSA screening cannot tell you if your PSA is high due to cancer or a different cause. A prostate biopsy is the only way to diagnose prostate cancer. A risk of having the PSA test is diagnosing and treating prostate cancer that would never have caused any symptoms or problems (overdiagnosis and overtreatment). Talk with your health care provider about what your results mean. Questions  to ask your health care provider Ask your health care provider, or the department that is doing the test: When will my results be ready? How will I get my results? What are my treatment options? What other tests do I need? What are my next steps? Summary The prostate-specific  antigen (PSA) test is a screening test for prostate cancer. Your health care provider may recommend that you have a PSA test starting at age 19 or that you have one earlier or later, depending on your risk factors for prostate cancer. A test result that is higher than 4 ng/mL may mean that you are at an increased risk for prostate cancer. However, elevated levels can be caused by a number of conditions other than prostate cancer. Talk with your health care provider about what your results mean. This information is not intended to replace advice given to you by your health care provider. Make sure you discuss any questions you have with your health care provider. Document Revised: 06/17/2020 Document Reviewed: 06/17/2020 Elsevier Patient Education  2022 [place].  Prostate Cancer Screening Prostate cancer screening is a test that is done to check for the presence of prostate cancer in men. The prostate gland is a walnut-sized gland that is located below the bladder and in front of the rectum in males. The function of the prostate is to add fluid to semen during ejaculation. Prostate cancer is the second most common type of cancer in men. Who should have prostate cancer screening? Screening recommendations vary based on age and other risk factors. Screening is recommended if: You are older than age 58. If you are age 34-69, talk with your health care provider about your need for screening and how often screening should be done. Because most prostate cancers are slow growing and will not cause death, screening is generally reserved in this age group for men who have a 10-15-year life expectancy. You are younger than age 64, and you have these risk factors: Being a [place] male or a male of African descent. Having a father, brother, or uncle who has been diagnosed with prostate cancer. The risk is higher if your family member's cancer occurred at an early age. Screening is not recommended if: You  are younger than age 44. You are between the ages of 37 and 79 and you have no risk factors. You are 74 years of age or older. At this age, the risks that screening can cause are greater than the benefits that it may provide. If you are at high risk for prostate cancer, your health care provider may recommend that you have screenings more often or that you start screening at a younger age. How is screening for prostate cancer done? The recommended prostate cancer screening test is a blood test called the prostate-specific antigen (PSA) test. PSA is a protein that is made in the prostate. As you age, your prostate naturally produces more PSA. Abnormally high PSA levels may be caused by: Prostate cancer. An enlarged prostate that is not caused by cancer (benign prostatic hyperplasia, BPH). This condition is very common in older men. A prostate gland infection (prostatitis). Depending on the PSA results, you may need more tests, such as: A physical exam to check the size of your prostate gland. Blood and imaging tests. A procedure to remove tissue samples from your prostate gland for testing (biopsy). What are the benefits of prostate cancer screening? Screening can help to identify cancer at an early stage, before symptoms start and  when the cancer can be treated more easily. There is a small chance that screening may lower your risk of dying from prostate cancer. The chance is small because prostate cancer is a slow-growing cancer, and most men with prostate cancer die from a different cause. What are the risks of prostate cancer screening? The main risk of prostate cancer screening is diagnosing and treating prostate cancer that would never have caused any symptoms or problems. This is called overdiagnosisand overtreatment. PSA screening cannot tell you if your PSA is high due to cancer or a different cause. A prostate biopsy is the only procedure to diagnose prostate cancer. Even the results of a  biopsy may not tell you if your cancer needs to be treated. Slow-growing prostate cancer may not need any treatment other than monitoring, so diagnosing and treating it may cause unnecessary stress or other side effects. Questions to ask your health care provider When should I start prostate cancer screening? What is my risk for prostate cancer? How often do I need screening? What type of screening tests do I need? How do I get my test results? What do my results mean? Do I need treatment? Where to find more information The American Cancer Society: www.cancer.org American Urological Association: www.auanet.org Contact a health care provider if: You have difficulty urinating. You have pain when you urinate or ejaculate. You have blood in your urine or semen. You have pain in your back or in the area of your prostate. Summary Prostate cancer is a common type of cancer in men. The prostate gland is located below the bladder and in front of the rectum. This gland adds fluid to semen during ejaculation. Prostate cancer screening may identify cancer at an early stage, when the cancer can be treated more easily. The prostate-specific antigen (PSA) test is the recommended screening test for prostate cancer. Discuss the risks and benefits of prostate cancer screening with your health care provider. If you are age 10 or older, the risks that screening can cause are greater than the benefits that it may provide. This information is not intended to replace advice given to you by your health care provider. Make sure you discuss any questions you have with your health care provider. Document Revised: 12/03/2020 Document Reviewed: 05/15/2019 Elsevier Patient Education  [place].

## 2021-07-26 NOTE — Addendum Note (Signed)
Addended by: Despina Hidden on: 07/26/2021 08:45 AM   Modules accepted: Orders

## 2021-09-03 ENCOUNTER — Ambulatory Visit
Admission: EM | Admit: 2021-09-03 | Discharge: 2021-09-03 | Disposition: A | Discharge status: Home / Self Care | Payer: Federal, State, Local not specified - PPO | Attending: Emergency Medicine | Admitting: Emergency Medicine

## 2021-09-03 ENCOUNTER — Other Ambulatory Visit: Payer: Self-pay

## 2021-09-03 DIAGNOSIS — R051 Acute cough: Secondary | ICD-10-CM

## 2021-09-03 DIAGNOSIS — J069 Acute upper respiratory infection, unspecified: Secondary | ICD-10-CM

## 2021-09-03 MED ORDER — IPRATROPIUM BROMIDE 0.06 % NA SOLN: 2.0000 | Freq: Four times a day (QID) | NASAL | 12 refills | Status: DC

## 2021-09-03 MED ORDER — BENZONATATE 100 MG PO CAPS: 200.0000 mg | ORAL_CAPSULE | Freq: Three times a day (TID) | ORAL | 0 refills | Status: DC

## 2021-09-03 MED ORDER — PROMETHAZINE-DM 6.25-15 MG/5ML PO SYRP: 5.0000 mL | ORAL_SOLUTION | Freq: Four times a day (QID) | ORAL | 0 refills | Status: DC | PRN

## 2021-09-03 MED ORDER — DOXYCYCLINE HYCLATE 100 MG PO CAPS: 100.0000 mg | ORAL_CAPSULE | Freq: Two times a day (BID) | ORAL | 0 refills | Status: DC

## 2021-09-03 NOTE — ED Provider Notes (Signed)
[facility]    CSN: [id number] Arrival date & time: 09/03/21  0827      History   Chief Complaint Chief Complaint  Patient presents with   Cough    HPI Albert Meyer is a 55 y.o. male.   HPI  63 old male here for evaluation of cough and headache.  Patient reports that he has been experiencing a nonproductive cough and a headache for the past week.  He has had a subjective fever, runny nose nasal congestion, and sore throat.  He does endorse some mild nausea but no vomiting or diarrhea.  He denies ear pain or pressure, shortness of breath or wheezing, or body aches.  His wife has similar symptoms and hers resolved with antibiotics.  He has a history of bronchitis and is concerned that he may be developing that again.  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 Authorizing Provider  amLODipine-olmesartan (AZOR) 10-40 MG tablet Take 1 tablet by mouth daily. 05/25/20  Yes [provider]  benzonatate (TESSALON) 100 MG capsule Take 2 capsules  (200 mg total) by mouth every 8 (eight) hours. 09/03/21  Yes Margarette Canada, NP  Cholecalciferol (VITAMIN D3 PO) Take by mouth.   Yes [provider]  doxycycline (VIBRAMYCIN) 100 MG capsule Take 1 capsule (100 mg total) by mouth 2 (two) times daily. 09/03/21  Yes Margarette Canada, NP  ipratropium (ATROVENT) 0.06 % nasal spray Place 2 sprays into both nostrils 4 (four) times daily. 09/03/21  Yes Margarette Canada, NP  metoprolol succinate (TOPROL-XL) 50 MG 24 hr tablet metoprolol succinate ER 50 mg tablet,extended release 24 hr   Yes [provider]  promethazine-dextromethorphan (PROMETHAZINE-DM) 6.25-15 MG/5ML syrup Take 5 mLs by mouth 4 (four) times daily as needed. 09/03/21  Yes Margarette Canada, NP  VITAMIN E PO Take by mouth.   Yes [provider]    Family History Family History  Problem Relation Age of Onset   Lung disease Father    Diabetes Mother     Social History Social History   Tobacco Use   Smoking status: Never   Smokeless tobacco: Never  Vaping Use   Vaping Use: Never used  Substance Use Topics   Alcohol use: No    Alcohol/week: 0.0 standard drinks   Drug use: No     Allergies   Penicillins   Review of Systems Review of Systems  Constitutional:  Positive for fever. Negative for activity change and appetite change.  HENT:  Positive for congestion, rhinorrhea and sore throat. Negative for ear pain.   Respiratory:  Positive for cough. Negative for shortness of breath and wheezing.   Gastrointestinal:  Positive for nausea. Negative for diarrhea and vomiting.  Musculoskeletal:  Negative for arthralgias and myalgias.  Skin:  Negative for rash.  Neurological:  Positive for headaches.  Hematological: Negative.   Psychiatric/Behavioral: Negative.      Physical Exam Triage Vital Signs ED Triage Vitals [09/03/21 0844]  Enc Vitals Group     BP 121/74     Pulse Rate 60     Resp 18     Temp 98.4 F (36.9 C)     Temp Source Oral     SpO2 99 %      Weight 205 lb (93 kg)     Height _0  (1.88 m)     Head Circumference      Peak Flow      Pain Score 0     Pain Loc      Pain Edu?      Excl. in [place]?    No data found.  Updated Vital Signs BP 121/74 (BP Location: Left Arm)   Pulse 60   Temp 98.4 F (36.9 C) (Oral)   Resp 18   Ht _1  (1.88 m)   Wt 205 lb (93 kg)   SpO2 99%   BMI 26.32 kg/m   Visual Acuity Right Eye Distance:   Left Eye Distance:   Bilateral Distance:    Right Eye Near:   Left Eye Near:    Bilateral Near:     Physical Exam Vitals and nursing note reviewed.  Constitutional:      General: He is not in acute distress.    Appearance: Normal appearance. He is normal weight. He is not ill-appearing.  HENT:     Head: Normocephalic and atraumatic.     Right Ear: Ear canal and external ear normal.     Left Ear: Ear canal and external ear normal.     Ears:     Comments: Bilateral tympanic membranes are erythematous and injected.    Nose: Congestion and rhinorrhea present.     Mouth/Throat:     Mouth: Mucous membranes are moist.     Pharynx: Oropharynx is clear. Posterior oropharyngeal erythema present.  Cardiovascular:     Rate and Rhythm: Normal rate and regular rhythm.     Pulses: Normal pulses.     Heart sounds: Normal heart sounds. No murmur heard.   No gallop.  Pulmonary:     Effort: Pulmonary effort is normal.     Breath sounds: Normal breath sounds. No wheezing, rhonchi or rales.  Musculoskeletal:     Cervical back: Normal range of motion and neck supple.  Lymphadenopathy:     Cervical: No cervical adenopathy.  Skin:    General: Skin is warm and dry.     Capillary Refill: Capillary refill takes less than 2 seconds.     Findings: No erythema or rash.  Neurological:     General: No focal deficit present.     Mental Status: He is alert and oriented to person, place, and time.  Psychiatric:        Mood and Affect: Mood normal.        Behavior: Behavior normal.        Thought Content:  Thought content normal.        Judgment: Judgment normal.     UC Treatments /  Results  Labs (all labs ordered are listed, but only abnormal results are displayed) Labs Reviewed - No data to display  EKG   Radiology No results found.  Procedures Procedures (including critical care time)  Medications Ordered in UC Medications - No data to display  Initial Impression / Assessment and Plan / UC Course  I have reviewed the triage vital signs and the nursing notes.  Pertinent labs & imaging results that were available during my care of the patient were reviewed by me and considered in my medical decision making (see chart for details).  Patient is a nontoxic-appearing 62 old male here for evaluation of respiratory complaints as outlined HPI above.  Patient's physical exam reveals bilateral erythematous injected tympanic membranes.  No effusion noted behind the tympanic membranes.  Both external auditory canals are clear.  Nasal mucosa is erythematous edematous with thick purulent discharge in both nares.  Patient does not have tenderness to his maxillary or frontal sinuses to percussion.  Oropharyngeal exam reveals posterior oropharyngeal erythema with thick yellow postnasal drip.  No cervical lymphadenopathy appreciated exam.  Cardiopulmonary exam reveals clear lung sounds in all fields.  Patient exam is consistent with upper respiratory infection but given the tenacity and purulence of the discharge I am concerned that it is bacterial in origin despite the 1 week of symptoms.  We will treat patient with doxycycline as he is allergic to penicillin twice daily for 10 days, sinus irrigation, Mucinex, Atrovent nasal spray to help with the nasal congestion, Tessalon Perles and Promethazine DM to help with cough.   Final Clinical Impressions(s) / UC Diagnoses   Final diagnoses:  Upper respiratory tract infection, unspecified type  Acute cough     Discharge Instructions      Take the  Doxycycline twice daily with food for 10 days for treatment of your URI.  Perform sinus irrigation 2-3 times a day with distilled water and a Neilmed sinus rinse kit to help with nasal congestion.  Use the Atrovent nasal spray, 2 squirts in each nostril every 6 hours, as needed for runny nose and postnasal drip.  Use the Tessalon Perles every 8 hours during the day.  Take them with a small sip of water.  They may give you some numbness to the base of your tongue or a metallic taste in your mouth, this is normal.  Use the Promethazine DM cough syrup at bedtime for cough and congestion.  It will make you drowsy so do not take it during the day.  Use OTC Tylenol and Ibuprofen as needed for pain or fever.   Return for reevaluation or see your primary care provider for any new or worsening symptoms.      ED Prescriptions     Medication Sig Dispense Auth. Provider   doxycycline (VIBRAMYCIN) 100 MG capsule Take 1 capsule (100 mg total) by mouth 2 (two) times daily. 20 capsule Margarette Canada, NP   benzonatate (TESSALON) 100 MG capsule Take 2 capsules (200 mg total) by mouth every 8 (eight) hours. 21 capsule Margarette Canada, NP   ipratropium (ATROVENT) 0.06 % nasal spray Place 2 sprays into both nostrils 4 (four) times daily. 15 mL Margarette Canada, NP   promethazine-dextromethorphan (PROMETHAZINE-DM) 6.25-15 MG/5ML syrup Take 5 mLs by mouth 4 (four) times daily as needed. 118 mL Margarette Canada, NP      PDMP not reviewed this encounter.   Margarette Canada, NP 09/03/21 1035

## 2021-09-03 NOTE — Discharge Instructions (Signed)
Take the Doxycycline twice daily with food for 10 days for treatment of your URI.  Perform sinus irrigation 2-3 times a day with distilled water and a Neilmed sinus rinse kit to help with nasal congestion.  Use the Atrovent nasal spray, 2 squirts in each nostril every 6 hours, as needed for runny nose and postnasal drip.  Use the Tessalon Perles every 8 hours during the day.  Take them with a small sip of water.  They may give you some numbness to the base of your tongue or a metallic taste in your mouth, this is normal.  Use the Promethazine DM cough syrup at bedtime for cough and congestion.  It will make you drowsy so do not take it during the day.  Use OTC Tylenol and Ibuprofen as needed for pain or fever.   Return for reevaluation or see your primary care provider for any new or worsening symptoms.  

## 2021-09-03 NOTE — ED Triage Notes (Signed)
Pt here with C/O cough and headache for 1 week. Pt wife was sick and given antibiotic.

## 2022-02-01 DIAGNOSIS — E785 Hyperlipidemia, unspecified: Secondary | ICD-10-CM

## 2022-02-01 HISTORY — DX: Hyperlipidemia, unspecified: E78.5

## 2022-07-25 ENCOUNTER — Ambulatory Visit: Payer: Federal, State, Local not specified - PPO | Admitting: Urology

## 2022-08-01 ENCOUNTER — Encounter: Payer: Self-pay | Admitting: Urology

## 2022-08-01 ENCOUNTER — Ambulatory Visit: Payer: Federal, State, Local not specified - PPO | Admitting: Urology

## 2022-08-01 DIAGNOSIS — R972 Elevated prostate specific antigen [PSA]: Secondary | ICD-10-CM

## 2022-08-01 NOTE — Progress Notes (Signed)
   08/01/2022 3:29 PM   Albert Meyer 06-17-1966 [id number]  Reason for visit: Follow up elevated PSA  HPI: 56 year old male with long history of elevated PSA of 6.2, 6.9, 7.7, and 8.4(22% free) in June 2022.  Biopsy was previously recommended by different urology provider, and he sought a second opinion with [place] here in [place].  With his refusal for biopsy, we ultimately opted for a prostate MRI in November 2021 which showed no suspicious lesions and 60 g prostate.  We again reviewed the guidelines regarding PSA screening and the risk of benefits of deferring biopsy.  We reviewed that based on the numbers he would have approximately 10% chance of prostate cancer based on the reassuring 22% free on recent reflex to free PSA.  I again offered biopsy, and he would like to defer.  He understands the risks of missing a clinically significant prostate cancer.  He also deferred DRE today.  He denies any urinary symptoms or gross hematuria.  We will repeat PSA today with reflex to free.  We discussed that this is >10, I would again recommend strongly considering prostate biopsy.  We also discussed he could consider a 4K score, but if that was worrisome for possible prostate cancer, I would again strongly recommend prostate biopsy.  Today reflex to free today, call with results, if >10 likely pursue 4K score if covered by insurance  [place] [facility], [place] [address], [place] [address], [place] [address] [phone]

## 2022-08-01 NOTE — Patient Instructions (Signed)

## 2022-08-18 ENCOUNTER — Ambulatory Visit
Admission: EM | Admit: 2022-08-18 | Discharge: 2022-08-18 | Disposition: A | Discharge status: Home / Self Care | Payer: Federal, State, Local not specified - PPO | Attending: Family Medicine | Admitting: Family Medicine

## 2022-08-18 DIAGNOSIS — H00015 Hordeolum externum left lower eyelid: Secondary | ICD-10-CM | POA: Diagnosis not present

## 2022-08-18 MED ORDER — ERYTHROMYCIN 5 MG/GM OP OINT: TOPICAL_OINTMENT | OPHTHALMIC | 0 refills | Status: AC

## 2022-08-18 NOTE — Discharge Instructions (Signed)
I believe you have a stye that is draining.  I sent some antibiotic ointment to the pharmacy.  Stop by your pharmacy to retrieve this medication.  Use as directed.

## 2022-08-18 NOTE — ED Provider Notes (Signed)
[facility]    CSN: [id number] Arrival date & time: 08/18/22  1652      History   Chief Complaint Chief Complaint  Patient presents with   Eye Problem    HPI HPI  Albert Meyer is a 56 y.o. male.    [name] presents for left eye pain that started a couple days ago.  Reports he has been waking up with crusting and noticed a "pinmple" on his lower left lid.  [name] does not feel like something is in his eye. No treatments prior to arrival.  [name] does wear glasses.  Davarius has not had any trouble seeing.  [name] has otherwise been well and has no additional concerns today.  No headache, sinus pressure or ear pain.     Past Medical History:  Diagnosis Date   Bronchitis    OFF AND ON FROM SUMMER TO OCTOBER 2016-RESOLVED NOW   Hodgkin disease ([place]) 1978   Hypertension     Patient Active Problem List   Diagnosis Date Noted   Low back pain 11/05/2019   Steatosis of liver 10/22/2018   Impaired fasting glucose 06/20/2018   Leukocytosis 12/24/2017   Raised prostate specific antigen 12/24/2017   Vitamin D deficiency 12/24/2017   Hand eczema 12/21/2017   Gallstone 09/22/2015   Calculus of gallbladder without cholecystitis without obstruction    RUQ pain    Hypertensive disorder 08/13/2015    Past Surgical History:  Procedure Laterality Date   CHOLECYSTECTOMY N/A 10/08/2015   Procedure: LAPAROSCOPIC CHOLECYSTECTOMY  With cholangiogram;  Surgeon: Robert Bellow, MD;  Location: [facility] ORS;  Service: General;  Laterality: N/A;   HERNIA REPAIR     SPLENECTOMY, TOTAL         Home Medications    Prior to Admission medications   Medication Sig Start Date End Date Taking? Authorizing Provider  erythromycin ophthalmic ointment Place a 1/2 inch ribbon of ointment into the lower eyelid. 08/18/22  Yes Bernadette Gores, DO  amLODipine-olmesartan (AZOR) 10-40 MG tablet Take 1 tablet by mouth daily. 05/25/20   [provider]  Cholecalciferol (VITAMIN  D3 PO) Take by mouth.    [provider]  metoprolol succinate (TOPROL-XL) 50 MG 24 hr tablet metoprolol succinate ER 50 mg tablet,extended release 24 hr    [provider]    Family History Family History  Problem Relation Age of Onset   Lung disease Father    Diabetes Mother     Social History Social History   Tobacco Use   Smoking status: Never    Passive exposure: Never   Smokeless tobacco: Never  Vaping Use   Vaping Use: Never used  Substance Use Topics   Alcohol use: No    Alcohol/week: 0.0 standard drinks of alcohol   Drug use: No     Allergies   Penicillins   Review of Systems Review of Systems : negative unless otherwise stated in HPI.      Physical Exam Triage Vital Signs ED Triage Vitals  Enc Vitals Group     BP 08/18/22 1718 120/77     Pulse Rate 08/18/22 1718 60     Resp 08/18/22 1718 16     Temp 08/18/22 1718 98.8 F (37.1 C)     Temp Source 08/18/22 1718 Oral     SpO2 08/18/22 1718 96 %     Weight --      Height --      Head Circumference --      Peak  Flow --      Pain Score 08/18/22 1717 1     Pain Loc --      Pain Edu? --      Excl. in [place]? --    No data found.  Updated Vital Signs BP 120/77 (BP Location: Left Arm)   Pulse 60   Temp 98.8 F (37.1 C) (Oral)   Resp 16   SpO2 96%   Visual Acuity Right Eye Distance:   Left Eye Distance:   Bilateral Distance:    Right Eye Near:   Left Eye Near:    Bilateral Near:     Physical Exam  GEN: pleasant well appearing male, in no acute distress  NECK: normal ROM  CV: regular rate RESP: no increased work of breathing EYES:     General: No foreign bodies appreciated. Vision grossly intact. Gaze aligned appropriately.        Right eye: No discharge, foreign body or borderline       Left eye: No foreign body. + discharge or + external lower lid hordeolum.     Extraocular Movements: Extraocular movements intact.     Conjunctiva/sclera: Mild left conjunctival  injection, no chemosis, no hemorrhage SKIN: warm and dry   UC Treatments / Results  Labs (all labs ordered are listed, but only abnormal results are displayed) Labs Reviewed - No data to display  EKG   Radiology No results found.  Procedures Procedures (including critical care time)  Medications Ordered in UC Medications - No data to display  Initial Impression / Assessment and Plan / UC Course  I have reviewed the triage vital signs and the nursing notes.  Pertinent labs & imaging results that were available during my care of the patient were reviewed by me and considered in my medical decision making (see chart for details).     Patient is a 56 y.o. male who presents after acute onset left eye drainage for the past  couple days.  On exam, he has a lower lid hordeolum.  Denies any difficulty seeing.  Sent erythromycin ointment to the pharmacy.  Advised to follow-up with an ophthalmologist or optometrist, if  discomfort/pain is not improving after 7-day course. Patient voiced understanding.  Discussed MDM, treatment plan and plan for follow-up with patient/parent who agrees with plan.  Final Clinical Impressions(s) / UC Diagnoses   Final diagnoses:  Hordeolum externum of left lower eyelid     Discharge Instructions      I believe you have a stye that is draining.  I sent some antibiotic ointment to the pharmacy.  Stop by your pharmacy to retrieve this medication.  Use as directed.     ED Prescriptions     Medication Sig Dispense Auth. Provider   erythromycin ophthalmic ointment Place a 1/2 inch ribbon of ointment into the lower eyelid. 3.5 g Lyndee Hensen, DO      PDMP not reviewed this encounter.   Lyndee Hensen, DO 08/18/22 1734

## 2022-08-18 NOTE — ED Triage Notes (Signed)
Patient presents to [facility] for left eyelid swelling since yesterday. Has not tried any OTC meds.   Denies fever or changes in vision.

## 2023-02-28 DIAGNOSIS — H539 Unspecified visual disturbance: Secondary | ICD-10-CM

## 2023-02-28 DIAGNOSIS — R7989 Other specified abnormal findings of blood chemistry: Secondary | ICD-10-CM | POA: Insufficient documentation

## 2023-02-28 HISTORY — DX: Other specified abnormal findings of blood chemistry: R79.89

## 2023-02-28 HISTORY — DX: Unspecified visual disturbance: H53.9

## 2023-03-02 ENCOUNTER — Other Ambulatory Visit: Payer: Self-pay

## 2023-03-06 ENCOUNTER — Other Ambulatory Visit: Payer: Self-pay

## 2023-03-06 ENCOUNTER — Telehealth: Payer: Self-pay

## 2023-03-06 DIAGNOSIS — Z1211 Encounter for screening for malignant neoplasm of colon: Secondary | ICD-10-CM

## 2023-03-06 MED ORDER — NA SULFATE-K SULFATE-MG SULF 17.5-3.13-1.6 GM/177ML PO SOLN: 1.0000 | Freq: Once | ORAL | 0 refills | Status: AC

## 2023-03-06 NOTE — Telephone Encounter (Signed)
Gastroenterology Pre-Procedure Review  Request Date: 04/06/23 Requesting Physician: Dr. Servando Snare  PATIENT REVIEW QUESTIONS: The patient responded to the following health history questions as indicated:    1. Are you having any GI issues? no 2. Do you have a personal history of Polyps? yes (patient stated he is on the every 5 year plan.  Did not see a record of his last colonoscopy to note if there were polyps present on the previous) 3. Do you have a family history of Colon Cancer or Polyps? no 4. Diabetes Mellitus? no 5. Joint replacements in the past 12 months?no 6. Major health problems in the past 3 months?no 7. Any artificial heart valves, MVP, or defibrillator?no    MEDICATIONS & ALLERGIES:    Patient reports the following regarding taking any anticoagulation/antiplatelet therapy:   Plavix, Coumadin, Eliquis, Xarelto, Lovenox, Pradaxa, Brilinta, or Effient? no Aspirin? no  Patient confirms/reports the following medications:  Current Outpatient Medications  Medication Sig Dispense Refill   metoprolol succinate (TOPROL-XL) 50 MG 24 hr tablet Take 1 tablet by mouth daily.     amLODipine-olmesartan (AZOR) 10-40 MG tablet Take 1 tablet by mouth daily.     Cholecalciferol (VITAMIN D3 PO) Take by mouth.     erythromycin ophthalmic ointment Place a 1/2 inch ribbon of ointment into the lower eyelid. 3.5 g 0   metoprolol succinate (TOPROL-XL) 50 MG 24 hr tablet metoprolol succinate ER 50 mg tablet,extended release 24 hr     No current facility-administered medications for this visit.    Patient confirms/reports the following allergies:  Allergies  Allergen Reactions   Penicillins Swelling    No orders of the defined types were placed in this encounter.   AUTHORIZATION INFORMATION Primary Insurance: 1D#: Group #:  Secondary Insurance: 1D#: Group #:  SCHEDULE INFORMATION: Date: 04/06/23 Time: Location: [facility]

## 2023-03-29 ENCOUNTER — Encounter: Payer: Self-pay | Admitting: Gastroenterology

## 2023-03-29 NOTE — Anesthesia Preprocedure Evaluation (Addendum)
Anesthesia Evaluation  Patient identified by MRN, date of birth, ID band Patient awake    Reviewed: Allergy & Precautions, H&P , NPO status , Patient's Chart, lab work & pertinent test results  Airway Mallampati: III  TM Distance: >3 FB Neck ROM: Full    Dental no notable dental hx.    Pulmonary neg pulmonary ROS   Pulmonary exam normal breath sounds clear to auscultation       Cardiovascular hypertension, Normal cardiovascular exam Rhythm:Regular Rate:Normal     Neuro/Psych negative neurological ROS  negative psych ROS   GI/Hepatic negative GI ROS, Neg liver ROS,,,  Endo/Other  negative endocrine ROS    Renal/GU negative Renal ROS  negative genitourinary   Musculoskeletal negative musculoskeletal ROS (+)    Abdominal   Peds negative pediatric ROS (+)  Hematology negative hematology ROS (+)   Anesthesia Other Findings Hodgkins disease when he was 57 years old, but not any longer Bronchitis Hypertension  noses   Low back pain  Hypertensive disorder Steatosis of liver Hyperlipidemia Serum ferritin above reference range Vitamin D deficiency Impaired fasting glycemia Body mass index 25-29 - overweight    Reproductive/Obstetrics negative OB ROS                             Anesthesia Physical Anesthesia Plan  ASA: 2  Anesthesia Plan: General   Post-op Pain Management:    Induction: Intravenous  PONV Risk Score and Plan:   Airway Management Planned: Natural Airway and Nasal Cannula  Additional Equipment:   Intra-op Plan:   Post-operative Plan:   Informed Consent: I have reviewed the patients History and Physical, chart, labs and discussed the procedure including the risks, benefits and alternatives for the proposed anesthesia with the patient or authorized representative who has indicated his/her understanding and acceptance.     Dental Advisory Given  Plan  Discussed with: Anesthesiologist, CRNA and Surgeon  Anesthesia Plan Comments: (Patient consented for risks of anesthesia including but not limited to:  - adverse reactions to medications - risk of airway placement if required - damage to eyes, teeth, lips or other oral mucosa - nerve damage due to positioning  - sore throat or hoarseness - Damage to heart, brain, nerves, lungs, other parts of body or loss of life  Patient voiced understanding.)        Anesthesia Quick Evaluation

## 2023-04-06 ENCOUNTER — Encounter: Payer: Self-pay | Admitting: Gastroenterology

## 2023-04-06 ENCOUNTER — Encounter
Admission: RE | Disposition: A | Discharge status: Home / Self Care | Payer: Self-pay | Source: Home / Self Care | Attending: Gastroenterology

## 2023-04-06 ENCOUNTER — Ambulatory Visit: Payer: Federal, State, Local not specified - PPO | Admitting: Anesthesiology

## 2023-04-06 ENCOUNTER — Other Ambulatory Visit: Payer: Self-pay

## 2023-04-06 ENCOUNTER — Ambulatory Visit
Admission: RE | Admit: 2023-04-06 | Discharge: 2023-04-06 | Disposition: A | Discharge status: Home / Self Care | Payer: Federal, State, Local not specified - PPO | Attending: Gastroenterology | Admitting: Gastroenterology

## 2023-04-06 ENCOUNTER — Other Ambulatory Visit: Payer: Self-pay | Admitting: Gastroenterology

## 2023-04-06 DIAGNOSIS — Z1211 Encounter for screening for malignant neoplasm of colon: Secondary | ICD-10-CM

## 2023-04-06 DIAGNOSIS — Z08 Encounter for follow-up examination after completed treatment for malignant neoplasm: Secondary | ICD-10-CM | POA: Insufficient documentation

## 2023-04-06 DIAGNOSIS — K635 Polyp of colon: Secondary | ICD-10-CM

## 2023-04-06 DIAGNOSIS — Z86010 Personal history of colonic polyps: Secondary | ICD-10-CM | POA: Insufficient documentation

## 2023-04-06 DIAGNOSIS — K640 First degree hemorrhoids: Secondary | ICD-10-CM | POA: Diagnosis not present

## 2023-04-06 DIAGNOSIS — I10 Essential (primary) hypertension: Secondary | ICD-10-CM | POA: Diagnosis not present

## 2023-04-06 DIAGNOSIS — Z8571 Personal history of Hodgkin lymphoma: Secondary | ICD-10-CM | POA: Diagnosis not present

## 2023-04-06 DIAGNOSIS — Z09 Encounter for follow-up examination after completed treatment for conditions other than malignant neoplasm: Secondary | ICD-10-CM | POA: Insufficient documentation

## 2023-04-06 DIAGNOSIS — K760 Fatty (change of) liver, not elsewhere classified: Secondary | ICD-10-CM | POA: Insufficient documentation

## 2023-04-06 HISTORY — DX: Fatty (change of) liver, not elsewhere classified: K76.0

## 2023-04-06 HISTORY — PX: COLONOSCOPY WITH PROPOFOL: SHX5780

## 2023-04-06 SURGERY — COLONOSCOPY WITH PROPOFOL
Anesthesia: General | Site: Rectum

## 2023-04-06 MED ORDER — STERILE WATER FOR IRRIGATION IR SOLN
Status: DC | PRN
  Administered 2023-04-06: 1

## 2023-04-06 MED ORDER — SODIUM CHLORIDE 0.9 % IV SOLN: INTRAVENOUS | Status: DC

## 2023-04-06 MED ORDER — LIDOCAINE HCL (CARDIAC) PF 100 MG/5ML IV SOSY
PREFILLED_SYRINGE | INTRAVENOUS | Status: DC | PRN
  Administered 2023-04-06: 60 mg via INTRAVENOUS

## 2023-04-06 MED ORDER — LACTATED RINGERS IV SOLN: INTRAVENOUS | Status: DC

## 2023-04-06 MED ORDER — PROPOFOL 10 MG/ML IV BOLUS
INTRAVENOUS | Status: DC | PRN
  Administered 2023-04-06: 90 mg via INTRAVENOUS
  Administered 2023-04-06: 40 mg via INTRAVENOUS
  Administered 2023-04-06: 30 mg via INTRAVENOUS
  Administered 2023-04-06 (×2): 40 mg via INTRAVENOUS

## 2023-04-06 MED ORDER — STERILE WATER FOR IRRIGATION IR SOLN
Status: DC | PRN
  Administered 2023-04-06: 60 mL

## 2023-04-06 SURGICAL SUPPLY — 21 items

## 2023-04-06 NOTE — Op Note (Signed)
[facility] Gastroenterology Patient Name: Albert Meyer Procedure Date: 04/06/2023 7:07 AM MRN: [id number] Account #: [id number] Date of Birth: Apr 12, 1966 Admit Type: Outpatient Age: 57 Room: [facility] OR ROOM 01 Gender: Male Note Status: Finalized Instrument Name: [id number] Procedure:             Colonoscopy Indications:           High risk colon cancer surveillance: Personal history                         of colonic polyps Providers:             Midge Minium MD, MD Referring MD:          Midge Minium MD, MD (Referring MD) Medicines:             Monitored Anesthesia Care, Propofol per Anesthesia Complications:         No immediate complications. Procedure:             Pre-Anesthesia Assessment:                        - Prior to the procedure, a History and Physical was                         performed, and patient medications and allergies were                         reviewed. The patient's tolerance of previous                         anesthesia was also reviewed. The risks and benefits                         of the procedure and the sedation options and risks                         were discussed with the patient. All questions were                         answered, and informed consent was obtained. Prior                         Anticoagulants: The patient has taken no anticoagulant                         or antiplatelet agents. ASA Grade Assessment: II - A                         patient with mild systemic disease. After reviewing                         the risks and benefits, the patient was deemed in                         satisfactory condition to undergo the procedure.                        After obtaining informed consent, the colonoscope was  passed under direct vision. Throughout the procedure,                         the patient's blood pressure, pulse, and oxygen                         saturations were monitored  continuously. The                         Colonoscope was introduced through the anus and                         advanced to the the cecum, identified by appendiceal                         orifice and ileocecal valve. The colonoscopy was                         performed without difficulty. The patient tolerated                         the procedure well. The quality of the bowel                         preparation was excellent. Findings:      The perianal and digital rectal examinations were normal.      A 4 mm polyp was found in the cecum. The polyp was sessile. The polyp       was removed with a cold snare. Resection and retrieval were complete.      Non-bleeding internal hemorrhoids were found during retroflexion. The       hemorrhoids were Grade I (internal hemorrhoids that do not prolapse). Impression:            - One 4 mm polyp in the cecum, removed with a cold                         snare. Resected and retrieved.                        - Non-bleeding internal hemorrhoids. Recommendation:        - Discharge patient to home.                        - Resume previous diet.                        - Continue present medications.                        - Await pathology results.                        - Repeat colonoscopy in 5 years for surveillance. Procedure Code(s):     --- Professional ---                        [phone], Colonoscopy, flexible; with removal of                         tumor(s), polyp(s), or  other lesion(s) by snare                         technique Diagnosis Code(s):     --- Professional ---                        Z86.010, Personal history of colonic polyps                        D12.0, Benign neoplasm of cecum CPT copyright 2022 American Medical Association. All rights reserved. The codes documented in this report are preliminary and upon coder review may  be revised to meet current compliance requirements. Midge Minium MD, MD 04/06/2023 7:41:45 AM This report  has been signed electronically. Number of Addenda: 0 Note Initiated On: 04/06/2023 7:07 AM Scope Withdrawal Time: 0 hours 9 minutes 31 seconds  Total Procedure Duration: 0 hours 15 minutes 22 seconds  Estimated Blood Loss:  Estimated blood loss: none.      [facility]

## 2023-04-06 NOTE — Transfer of Care (Signed)
Immediate Anesthesia Transfer of Care Note  Patient: Albert Meyer  Procedure(s) Performed: COLONOSCOPY WITH PROPOFOL (Rectum)  Patient Location: PACU  Anesthesia Type: General  Level of Consciousness: awake, alert  and patient cooperative  Airway and Oxygen Therapy: Patient Spontanous Breathing and Patient connected to supplemental oxygen  Post-op Assessment: Post-op Vital signs reviewed, Patient's Cardiovascular Status Stable, Respiratory Function Stable, Patent Airway and No signs of Nausea or vomiting  Post-op Vital Signs: Reviewed and stable  Complications: No notable events documented.

## 2023-04-06 NOTE — Anesthesia Postprocedure Evaluation (Signed)
Anesthesia Post Note  Patient: Albert Meyer  Procedure(s) Performed: COLONOSCOPY WITH PROPOFOL (Rectum)  Patient location during evaluation: PACU Anesthesia Type: General Level of consciousness: awake and alert Pain management: pain level controlled Vital Signs Assessment: post-procedure vital signs reviewed and stable Respiratory status: spontaneous breathing, nonlabored ventilation, respiratory function stable and patient connected to nasal cannula oxygen Cardiovascular status: stable and blood pressure returned to baseline Postop Assessment: no apparent nausea or vomiting Anesthetic complications: no   No notable events documented.   Last Vitals:  Vitals:   04/06/23 0745 04/06/23 0755  BP: 94/66 103/68  Pulse: 66 89  Resp: (!) 28 (!) 24  Temp: 36.7 C 36.7 C  SpO2: 94% 95%    Last Pain:  Vitals:   04/06/23 0755  TempSrc:   PainSc: 0-No pain                 Marisue Humble

## 2023-04-06 NOTE — H&P (Signed)
Midge Minium, MD [facility] [address], [address] [name], [place] [address] Phone:[phone] Fax : [phone]  Primary Care Physician:  Verita Lamb Primary Gastroenterologist:  Dr. Servando Snare  Pre-Procedure History & Physical: HPI:  Albert Meyer is a 57 y.o. male is here for an colonoscopy.   Past Medical History:  Diagnosis Date   Bronchitis    OFF AND ON FROM SUMMER TO OCTOBER 2016-RESOLVED NOW   Hodgkin disease ([facility]) 1978   Hypertension    Steatosis of liver     Past Surgical History:  Procedure Laterality Date   CHOLECYSTECTOMY N/A 10/08/2015   Procedure: LAPAROSCOPIC CHOLECYSTECTOMY  With cholangiogram;  Surgeon: Earline Mayotte, MD;  Location: [facility] ORS;  Service: General;  Laterality: N/A;   HERNIA REPAIR     SPLENECTOMY, TOTAL      Prior to Admission medications   Medication Sig Start Date End Date Taking? Authorizing Provider  amLODipine-olmesartan (AZOR) 10-40 MG tablet Take 1 tablet by mouth daily. 05/25/20  Yes [provider]  Cholecalciferol (VITAMIN D3 PO) Take by mouth.   Yes [provider]  metoprolol succinate (TOPROL-XL) 50 MG 24 hr tablet Take 1 tablet by mouth daily. 02/28/23  Yes [provider]  omega-3 acid ethyl esters (LOVAZA) 1 g capsule Take by mouth 2 (two) times daily.   Yes [provider]  erythromycin ophthalmic ointment Place a 1/2 inch ribbon of ointment into the lower eyelid. Patient not taking: Reported on 03/29/2023 08/18/22   Katha Cabal, DO  metoprolol succinate (TOPROL-XL) 50 MG 24 hr tablet metoprolol succinate ER 50 mg tablet,extended release 24 hr Patient not taking: Reported on 03/29/2023    [provider]    Allergies as of 03/06/2023 - Review Complete 08/18/2022  Allergen Reaction Noted   Penicillins Swelling 07/08/2015    Family History  Problem Relation Age of Onset   Lung disease Father    Diabetes Mother     Social History   Socioeconomic History    Marital status: Married    Spouse name: Not on file   Number of children: Not on file   Years of education: Not on file   Highest education level: Not on file  Occupational History   Not on file  Tobacco Use   Smoking status: Never    Passive exposure: Never   Smokeless tobacco: Never  Vaping Use   Vaping Use: Never used  Substance and Sexual Activity   Alcohol use: No    Alcohol/week: 0.0 standard drinks of alcohol   Drug use: No   Sexual activity: Yes  Other Topics Concern   Not on file  Social History Narrative   Not on file   Social Determinants of Health   Financial Resource Strain: Not on file  Food Insecurity: Not on file  Transportation Needs: Not on file  Physical Activity: Not on file  Stress: Not on file  Social Connections: Not on file  Intimate Partner Violence: Not on file    Review of Systems: See HPI, otherwise negative ROS  Physical Exam: BP (!) 138/90   Pulse 91   Temp 98.4 F (36.9 C) (Temporal)   Resp (!) 22   Ht 6\' 2"  (1.88 m)   Wt 90.3 kg   SpO2 95%   BMI 25.55 kg/m  General:   Alert,  pleasant and cooperative in NAD Head:  Normocephalic and atraumatic. Neck:  Supple; no masses or thyromegaly. Lungs:  Clear throughout to auscultation.    Heart:  Regular rate and rhythm. Abdomen:  Soft, nontender and nondistended. Normal bowel sounds, without guarding, and without rebound.   Neurologic:  Alert and  oriented x4;  grossly normal neurologically.  Impression/Plan: Albert Meyer is here for an colonoscopy to be performed for a history of adenomatous polyps on 2019   Risks, benefits, limitations, and alternatives regarding  colonoscopy have been reviewed with the patient.  Questions have been answered.  All parties agreeable.   Midge Minium, MD  04/06/2023, 7:05 AM

## 2023-04-09 ENCOUNTER — Encounter: Payer: Self-pay | Admitting: Gastroenterology

## 2023-04-11 LAB — ANATOMIC PATHOLOGY REPORT

## 2023-04-12 ENCOUNTER — Encounter: Payer: Self-pay | Admitting: Gastroenterology

## 2023-04-13 ENCOUNTER — Other Ambulatory Visit: Payer: Self-pay

## 2023-09-04 ENCOUNTER — Other Ambulatory Visit: Payer: Self-pay

## 2023-09-05 ENCOUNTER — Ambulatory Visit
Admission: EM | Admit: 2023-09-05 | Discharge: 2023-09-05 | Disposition: A | Discharge status: Home / Self Care | Payer: Federal, State, Local not specified - PPO

## 2023-09-05 ENCOUNTER — Other Ambulatory Visit: Payer: Self-pay | Admitting: Family Medicine

## 2023-09-05 DIAGNOSIS — M7712 Lateral epicondylitis, left elbow: Secondary | ICD-10-CM

## 2023-09-05 NOTE — ED Triage Notes (Addendum)
Patient states that he noticed his left elbow pain yesterday while lifting a gallon of milk. Patient took Ibu today that helped. When it wore off the left forearm was hurting as well. Patient states that he was taking something heavy apart on Sunday. Unsure if related.

## 2023-09-05 NOTE — ED Provider Notes (Signed)
[facility]    CSN: [id number] Arrival date & time: 09/05/23  1841      History   Chief Complaint Chief Complaint  Patient presents with   Elbow Pain    HPI Albert Meyer is a 57 y.o. male.   HPI  57 year old male with a past medical history significant for hypertension, bronchitis, and Hodgkin's disease presents for evaluation of pain in his left elbow that started yesterday after lifting up a gallon of milk.  He reports that this past weekend he disassembled a portable aluminum sawmill but does not remember any kind of injury during that activity.  The pain is increased today to the point where he is having discomfort with any range of motion of his forearm or elbow.  Past Medical History:  Diagnosis Date   Bronchitis    OFF AND ON FROM SUMMER TO OCTOBER 2016-RESOLVED NOW   Hodgkin disease (HCC) 1978   Hypertension    Steatosis of liver     Patient Active Problem List   Diagnosis Date Noted   Encounter for screening colonoscopy 04/06/2023   Polyp of colon 04/06/2023   Low back pain 11/05/2019   Steatosis of liver 10/22/2018   Impaired fasting glucose 06/20/2018   Leukocytosis 12/24/2017   Raised prostate specific antigen 12/24/2017   Vitamin D deficiency 12/24/2017   Hand eczema 12/21/2017   Gallstone 09/22/2015   Calculus of gallbladder without cholecystitis without obstruction    RUQ pain    Hypertensive disorder 08/13/2015    Past Surgical History:  Procedure Laterality Date   CHOLECYSTECTOMY N/A 10/08/2015   Procedure: LAPAROSCOPIC CHOLECYSTECTOMY  With cholangiogram;  Surgeon: Earline Mayotte, MD;  Location: [facility];  Service: General;  Laterality: N/A;   COLONOSCOPY WITH PROPOFOL N/A 04/06/2023   Procedure: COLONOSCOPY WITH PROPOFOL;  Surgeon: Midge Minium, MD;  Location: [facility];  Service: Endoscopy;  Laterality: N/A;   HERNIA REPAIR     SPLENECTOMY, TOTAL         Home Medications    Prior to Admission medications    Medication Sig Start Date End Date Taking? Authorizing Provider  amLODipine-olmesartan (AZOR) 10-40 MG tablet Take 1 tablet by mouth daily. 05/25/20  Yes [provider]  metoprolol succinate (TOPROL-XL) 50 MG 24 hr tablet Take 1 tablet by mouth daily. 02/28/23  Yes [provider]  Cholecalciferol (VITAMIN D3 PO) Take by mouth.    [provider]  erythromycin ophthalmic ointment Place a 1/2 inch ribbon of ointment into the lower eyelid. Patient not taking: Reported on 03/29/2023 08/18/22   Katha Cabal, DO  metoprolol succinate (TOPROL-XL) 50 MG 24 hr tablet metoprolol succinate ER 50 mg tablet,extended release 24 hr Patient not taking: Reported on 03/29/2023    [provider]  omega-3 acid ethyl esters (LOVAZA) 1 g capsule Take by mouth 2 (two) times daily.    [provider]    Family History Family History  Problem Relation Age of Onset   Lung disease Father    Diabetes Mother     Social History Social History   Tobacco Use   Smoking status: Never    Passive exposure: Never   Smokeless tobacco: Never  Vaping Use   Vaping status: Never Used  Substance Use Topics   Alcohol use: No    Alcohol/week: 0.0 standard drinks of alcohol   Drug use: No     Allergies   Penicillins   Review of Systems Review of Systems  Constitutional:  Negative  for fever.  Musculoskeletal:  Positive for arthralgias. Negative for joint swelling.  Skin:  Negative for color change.  Neurological:  Negative for weakness and numbness.     Physical Exam Triage Vital Signs ED Triage Vitals [09/05/23 1859]  Encounter Vitals Group     BP      Systolic BP Percentile      Diastolic BP Percentile      Pulse      Resp      Temp      Temp src      SpO2      Weight      Height      Head Circumference      Peak Flow      Pain Score 3     Pain Loc      Pain Education      Exclude from Growth Chart    No data found.  Updated Vital Signs BP (!)  144/85 (BP Location: Right Arm)   Pulse (!) 55   Temp 98 F (36.7 C) (Oral)   Resp 17   SpO2 94%   Visual Acuity Right Eye Distance:   Left Eye Distance:   Bilateral Distance:    Right Eye Near:   Left Eye Near:    Bilateral Near:     Physical Exam Vitals and nursing note reviewed.  Constitutional:      Appearance: Normal appearance. He is not ill-appearing.  HENT:     Head: Normocephalic and atraumatic.  Musculoskeletal:        General: Tenderness present. No swelling or signs of injury.  Skin:    General: Skin is warm and dry.     Capillary Refill: Capillary refill takes less than 2 seconds.     Findings: No rash.  Neurological:     General: No focal deficit present.     Mental Status: He is alert and oriented to person, place, and time.      UC Treatments / Results  Labs (all labs ordered are listed, but only abnormal results are displayed) Labs Reviewed - No data to display  EKG   Radiology No results found.  Procedures Procedures (including critical care time)  Medications Ordered in UC Medications - No data to display  Initial Impression / Assessment and Plan / UC Course  I have reviewed the triage vital signs and the nursing notes.  Pertinent labs & imaging results that were available during my care of the patient were reviewed by me and considered in my medical decision making (see chart for details).   Patient is a very pleasant, nontoxic-appearing 57 year old male presenting for evaluation of pain in the outside of his left elbow that radiates into the forearm that started yesterday after lifting a gallon of milk.  On exam patient's left elbow is in normal anatomical alignment with significant tenderness with palpation of the lateral epicondyle.  No pain with palpation of the muscles of the forearm, radial head, olecranon process, or medial condyle.  Grip strength is 5/5 in the left hand and radial and ulnar pulses are 2+.  No changes in sensation.   Patient exam is consistent with lateral epicondylitis.  I will discharge him home with a diagnosis of same with instructions to use over-the-counter NSAIDs, compression sleeve, ice, and home physical therapy.  If symptoms not improve he can return for reevaluation or follow-up with orthopedics.   Final Clinical Impressions(s) / UC Diagnoses   Final diagnoses:  Lateral epicondylitis of  left elbow     Discharge Instructions      Rest is much as possible and avoid any activities that exacerbate your pain.  You may apply ice to your elbow for 20 minutes at a time, 2-3 times a day.  Make sure there is a cloth between the ice and your skin so as to not cause skin damage.  Take over-the-counter nonsteroidal anti-inflammatory drugs such as ibuprofen or Aleve.  Take it according to package instructions.  Follow the physical therapy guidelines given in your discharge instructions.  Wear compression sleeve to add support to your elbow and help decrease swelling and aid in pain relief.  If your symptoms do not improve, or they worsen, either return for reevaluation or follow-up with orthopedics.     ED Prescriptions   None    PDMP not reviewed this encounter.   Becky Augusta, NP 09/05/23 1919

## 2023-09-05 NOTE — Discharge Instructions (Addendum)
Rest is much as possible and avoid any activities that exacerbate your pain.  You may apply ice to your elbow for 20 minutes at a time, 2-3 times a day.  Make sure there is a cloth between the ice and your skin so as to not cause skin damage.  Take over-the-counter nonsteroidal anti-inflammatory drugs such as ibuprofen or Aleve.  Take it according to package instructions.  Follow the physical therapy guidelines given in your discharge instructions.  Wear compression sleeve to add support to your elbow and help decrease swelling and aid in pain relief.  If your symptoms do not improve, or they worsen, either return for reevaluation or follow-up with orthopedics.

## 2023-11-12 ENCOUNTER — Other Ambulatory Visit: Payer: Self-pay

## 2023-11-14 ENCOUNTER — Ambulatory Visit: Payer: Federal, State, Local not specified - PPO | Admitting: Physician Assistant

## 2023-11-14 ENCOUNTER — Encounter: Payer: Self-pay | Admitting: Physician Assistant

## 2023-11-14 VITALS — BP 128/77 | HR 111 | Temp 98.6°F | Wt 210.6 lb

## 2023-11-14 DIAGNOSIS — R7989 Other specified abnormal findings of blood chemistry: Secondary | ICD-10-CM

## 2023-11-14 DIAGNOSIS — K760 Fatty (change of) liver, not elsewhere classified: Secondary | ICD-10-CM | POA: Diagnosis not present

## 2023-11-14 NOTE — Patient Instructions (Signed)
RUQ Ultrasound scheduled @ [facility] 11-16-23 arrive at 7:30. Nothing to eat/drink after midnight.

## 2023-11-14 NOTE — Progress Notes (Signed)
Celso Amy, PA-C [address]  [address]  [place], [place] [address]  Main: [phone]  Fax: [phone]   Gastroenterology Consultation  Referring Provider:     Real Cons, PA-C Primary Care Physician:  Verita Lamb Primary Gastroenterologist:  Celso Amy, PA-C / Dr. Midge Minium   Reason for Consultation:     Fatty liver.        HPI:   Albert Meyer is a 58 y.o. y/o male referred for consultation & management  by Real Cons, PA-C.  Here to evaluate fatty liver disease.  He denies any GI symptoms such as abdominal pain, abdominal swelling, or extremity edema.  He does not drink alcohol.  Rarely takes Tylenol or ibuprofen.  No family history of liver disease.  09/04/2023 labs: Elevated ferritin 526, Normal vitamin D,  A1c 5.7.  Normal liver tests with total bilirubin 0.3, alk phos 80, AST 30, ALT 33.  Normal CBC with hemoglobin 15.4, platelets 288.  Creatinine 1.35.  He had cholecystectomy for cholelithiasis in 2016.  Liver echogenicity was normal on 2016 ultrasound.  Colonoscopy 03/2023 by Dr. Servando Snare showed 1 small 4 mm colon mucosal polyp removed from cecum, internal hemorrhoids, otherwise normal.  Excellent prep.  Previous history of colon polyps.  5-year repeat.  Past Medical History:  Diagnosis Date   Benign prostatic hyperplasia 11/17/2020   Bronchitis    OFF AND ON FROM SUMMER TO OCTOBER 2016-RESOLVED NOW   Elevated prostate specific antigen (PSA) 12/24/2017   High serum ferritin 02/28/2023   Hodgkin disease (HCC) 1978   Hodgkin's disease (HCC) 08/13/2015   age 13, left chest/neck with LN dissection, chemo and radiation     Hyperlipidemia 02/01/2022   Hypertension    Obesity with body mass index 30 or greater 12/21/2017   Overweight with body mass index (BMI) 25.0-29.9 04/30/2019   Steatosis of liver    Visual disturbance 02/28/2023    Past Surgical History:  Procedure Laterality Date   CHOLECYSTECTOMY N/A 10/08/2015    Procedure: LAPAROSCOPIC CHOLECYSTECTOMY  With cholangiogram;  Surgeon: Earline Mayotte, MD;  Location: [facility];  Service: General;  Laterality: N/A;   COLONOSCOPY WITH PROPOFOL N/A 04/06/2023   Procedure: COLONOSCOPY WITH PROPOFOL;  Surgeon: Midge Minium, MD;  Location: [facility];  Service: Endoscopy;  Laterality: N/A;   HERNIA REPAIR     SPLENECTOMY, TOTAL      Prior to Admission medications   Medication Sig Start Date End Date Taking? Authorizing Provider  amLODipine-olmesartan (AZOR) 10-40 MG tablet Take 1 tablet by mouth daily. 05/25/20   [provider]  Cholecalciferol (VITAMIN D3 PO) Take by mouth.    [provider]  erythromycin ophthalmic ointment Place a 1/2 inch ribbon of ointment into the lower eyelid. Patient not taking: Reported on 03/29/2023 08/18/22   Katha Cabal, DO  metoprolol succinate (TOPROL-XL) 50 MG 24 hr tablet metoprolol succinate ER 50 mg tablet,extended release 24 hr Patient not taking: Reported on 03/29/2023    [provider]  metoprolol succinate (TOPROL-XL) 50 MG 24 hr tablet Take 1 tablet by mouth daily. 02/28/23   [provider]  omega-3 acid ethyl esters (LOVAZA) 1 g capsule Take by mouth 2 (two) times daily.    [provider]    Family History  Problem Relation Age of Onset   Lung disease Father    Diabetes Mother      Social History   Tobacco Use   Smoking status: Never  Passive exposure: Never   Smokeless tobacco: Never  Vaping Use   Vaping status: Never Used  Substance Use Topics   Alcohol use: No    Alcohol/week: 0.0 standard drinks of alcohol   Drug use: No    Allergies as of 11/14/2023 - Review Complete 11/14/2023  Allergen Reaction Noted   Penicillins Swelling 07/08/2015    Review of Systems:    All systems reviewed and negative except where noted in HPI.   Physical Exam:  BP 128/77   Pulse (!) 111   Temp 98.6 F (37 C) (Oral)   Wt 210 lb 9.6 oz (95.5 kg)   BMI  27.04 kg/m  No LMP for male patient.  General:   Alert,  Well-developed, well-nourished, pleasant and cooperative in NAD Lungs:  Respirations even and unlabored.  Clear throughout to auscultation.   No wheezes, crackles, or rhonchi. No acute distress. Heart:  Regular rate and rhythm; no murmurs, clicks, rubs, or gallops. Abdomen:  Normal bowel sounds.  No bruits.  Soft, and non-distended without masses, hepatosplenomegaly or hernias noted.  No Tenderness.  No guarding or rebound tenderness.    Neurologic:  Alert and oriented x3;  grossly normal neurologically. Psych:  Alert and cooperative. Normal mood and affect.  Imaging Studies: No results found.  Assessment and Plan:   Albert Meyer is a 58 y.o. y/o male has been referred for:  Fatty Liver Elevated Ferritin  I reviewed his labs from 08/2023.  All liver tests were normal.  Plan:  -Request records from PCP. -Labs: Hepatic Panal.  Hep A/B/C labs. Iron Panal & Ferritin. -If not immune to Hepatitis A or B, then I will offer Vaccine. -RUQ Ultrasound -Recommend a low-fat diet, regular exercise, and weight loss. Patient education handout about fatty liver disease was given. -Avoid Tylenol / Acetaminophen.  He does NOT drink alcohol.  Follow up As needed based on Test Results.  Celso Amy, PA-C

## 2023-11-15 ENCOUNTER — Encounter: Payer: Self-pay | Admitting: Physician Assistant

## 2023-11-16 ENCOUNTER — Ambulatory Visit: Payer: Federal, State, Local not specified - PPO

## 2023-11-21 ENCOUNTER — Telehealth: Payer: Self-pay

## 2023-11-21 ENCOUNTER — Encounter: Payer: Self-pay | Admitting: Physician Assistant

## 2023-11-21 LAB — HEPATIC FUNCTION PANEL
ALT: 39 [IU]/L (ref 0–44)
AST: 28 [IU]/L (ref 0–40)
Albumin: 4.6 g/dL (ref 3.8–4.9)
Alkaline Phosphatase: 80 [IU]/L (ref 44–121)
Bilirubin Total: 0.4 mg/dL (ref 0.0–1.2)
Bilirubin, Direct: 0.12 mg/dL (ref 0.00–0.40)
Total Protein: 7.3 g/dL (ref 6.0–8.5)

## 2023-11-21 LAB — IRON,TIBC AND FERRITIN PANEL
Ferritin: 534 ng/mL — ABNORMAL HIGH (ref 30–400)
Iron Saturation: 37 % (ref 15–55)
Iron: 103 ug/dL (ref 38–169)
Total Iron Binding Capacity: 276 ug/dL (ref 250–450)
UIBC: 173 ug/dL (ref 111–343)

## 2023-11-21 LAB — HEPATITIS B CORE ANTIBODY, IGM: Hep B C IgM: NEGATIVE

## 2023-11-21 LAB — HCV INTERPRETATION

## 2023-11-21 LAB — HEPATITIS B SURFACE ANTIBODY,QUALITATIVE: Hep B Surface Ab, Qual: NONREACTIVE

## 2023-11-21 LAB — HEPATITIS A ANTIBODY, TOTAL: hep A Total Ab: NEGATIVE

## 2023-11-21 LAB — HCV AB W REFLEX TO QUANT PCR: HCV Ab: NONREACTIVE

## 2023-11-21 LAB — HEPATITIS B CORE ANTIBODY, TOTAL: Hep B Core Total Ab: NEGATIVE

## 2023-11-21 LAB — HEPATITIS A ANTIBODY, IGM: Hep A IgM: NEGATIVE

## 2023-11-21 NOTE — Telephone Encounter (Signed)
 Left message for patient to return call to office.  Need to schedule Hep A and B if patient agrees  Hi Albert Meyer, your labs show: 1.  Persistent elevated ferritin, which is not worrisome.  Ferritin levels tend to increase with age, especially in men.  Arthritis and alcohol use can also cause elevated ferritin.  Very nonspecific.  Not worrisome. 2.  Normal total iron and iron saturation.  No evidence of iron overload or hemochromatosis. 3.  Normal liver tests. 4.  Negative hepatitis A, B, and C labs.  You are not immune to hepatitis A or B.  I recommend we give you the hepatitis A and B vaccine.  I will have my medical assistant call you to schedule vaccine if you agree. Albert Console, PA-C

## 2023-11-21 NOTE — Telephone Encounter (Signed)
 Patient is going out of town -he will call when he gets back to schedule nurse visit for Hep A and B vaccine.
